# Patient Record
Sex: Male | Born: 1960 | Hispanic: Yes | Marital: Married | Smoking: Never smoker
Health system: Southern US, Community
[De-identification: ages and names within clinical notes are randomized; demographics above are authoritative.]

## PROBLEM LIST (undated history)

## (undated) DIAGNOSIS — E119 Type 2 diabetes mellitus without complications: Secondary | ICD-10-CM

## (undated) DIAGNOSIS — I10 Essential (primary) hypertension: Secondary | ICD-10-CM

---

## 2015-02-22 ENCOUNTER — Inpatient Hospital Stay (HOSPITAL_COMMUNITY)
Admission: EM | Admit: 2015-02-22 | Discharge: 2015-02-25 | DRG: 188 | Disposition: A | Discharge status: Home / Self Care | Payer: BC Managed Care – PPO | Attending: Internal Medicine | Admitting: Internal Medicine

## 2015-02-22 ENCOUNTER — Encounter (HOSPITAL_COMMUNITY): Payer: Self-pay | Admitting: Emergency Medicine

## 2015-02-22 ENCOUNTER — Emergency Department (HOSPITAL_COMMUNITY): Payer: BC Managed Care – PPO

## 2015-02-22 DIAGNOSIS — Z87891 Personal history of nicotine dependence: Secondary | ICD-10-CM

## 2015-02-22 DIAGNOSIS — J189 Pneumonia, unspecified organism: Secondary | ICD-10-CM | POA: Diagnosis present

## 2015-02-22 DIAGNOSIS — E119 Type 2 diabetes mellitus without complications: Secondary | ICD-10-CM

## 2015-02-22 DIAGNOSIS — E785 Hyperlipidemia, unspecified: Secondary | ICD-10-CM | POA: Diagnosis present

## 2015-02-22 DIAGNOSIS — J90 Pleural effusion, not elsewhere classified: Principal | ICD-10-CM | POA: Diagnosis present

## 2015-02-22 DIAGNOSIS — I10 Essential (primary) hypertension: Secondary | ICD-10-CM | POA: Diagnosis present

## 2015-02-22 DIAGNOSIS — R0600 Dyspnea, unspecified: Secondary | ICD-10-CM | POA: Diagnosis not present

## 2015-02-22 HISTORY — DX: Type 2 diabetes mellitus without complications: E11.9

## 2015-02-22 HISTORY — DX: Essential (primary) hypertension: I10

## 2015-02-22 LAB — CBC
HEMATOCRIT: 41 % (ref 39.0–52.0)
HEMOGLOBIN: 13.8 g/dL (ref 13.0–17.0)
MCH: 32.6 pg (ref 26.0–34.0)
MCHC: 33.7 g/dL (ref 30.0–36.0)
MCV: 96.9 fL (ref 78.0–100.0)
Platelets: 238 10*3/uL (ref 150–400)
RBC: 4.23 MIL/uL (ref 4.22–5.81)
RDW: 12.3 % (ref 11.5–15.5)
WBC: 8 10*3/uL (ref 4.0–10.5)

## 2015-02-22 LAB — BASIC METABOLIC PANEL
Anion gap: 12 (ref 5–15)
BUN: 11 mg/dL (ref 6–20)
CO2: 25 mmol/L (ref 22–32)
Calcium: 9 mg/dL (ref 8.9–10.3)
Chloride: 100 mmol/L — ABNORMAL LOW (ref 101–111)
Creatinine, Ser: 1.07 mg/dL (ref 0.61–1.24)
GFR calc non Af Amer: >60 mL/min (ref >60)
Glucose, Bld: 128 mg/dL — ABNORMAL HIGH (ref 65–99)
Potassium: 3.9 mmol/L (ref 3.5–5.1)
Sodium: 137 mmol/L (ref 135–145)

## 2015-02-22 LAB — I-STAT TROPONIN, ED: TROPONIN I, POC: 0.01 ng/mL (ref 0.00–0.08)

## 2015-02-22 MED ORDER — IOHEXOL 300 MG/ML  SOLN
75.0000 mL | Freq: Once | INTRAMUSCULAR | Status: AC | PRN
  Administered 2015-02-22: 75 mL via INTRAVENOUS

## 2015-02-22 MED ORDER — DEXTROSE 5 % IV SOLN
1.0000 g | INTRAVENOUS | Status: DC
  Administered 2015-02-22: 1 g via INTRAVENOUS
  Filled 2015-02-22 (×2): qty 10

## 2015-02-22 MED ORDER — DEXTROSE 5 % IV SOLN
500.0000 mg | INTRAVENOUS | Status: DC
  Administered 2015-02-22: 500 mg via INTRAVENOUS
  Filled 2015-02-22 (×2): qty 500

## 2015-02-22 NOTE — ED Notes (Signed)
PA at bedside.

## 2015-02-22 NOTE — ED Provider Notes (Signed)
CSN: [id number]     Arrival date & time 02/22/15  2027 History   First MD Initiated Contact with Patient 02/22/15 2130     Chief Complaint  Patient presents with  . Shortness of Breath     (Consider location/radiation/quality/duration/timing/severity/associated sxs/prior Treatment) HPI Comments: 54 year old male with a past medical history of diabetes presenting from [facility] urgent care in [place] complaining of shortness of breath 1 week. At urgent care, he had a chest x-ray that showed a "right lung problem", was given 2 shots (patient is not sure what the shots were) and was advised to go to the emergency department. Over the past week, he's had shortness of breath both at rest and on exertion, feels winded and has had wheezing. Symptoms are worsening over the past 2 days. Admits to associated hacking cough that is occasionally productive beginning yesterday. Denies fever, chills, chest pain. Reports a discomfort across the middle of his back. No recent travel or illness. No recent hospital admissions. Has not smoked for many years.  Patient is a 54 y.o. male presenting with shortness of breath. The history is provided by the patient and a relative.  Shortness of Breath Associated symptoms: cough     Past Medical History  Diagnosis Date  . Diabetes mellitus without complication     Patient states he takes 2 metformin pills twice a day.   History reviewed. No pertinent past surgical history. No family history on file. History  Substance Use Topics  . Smoking status: Never Smoker   . Smokeless tobacco: Not on file  . Alcohol Use: Yes    Review of Systems  Constitutional: Positive for fatigue.  Respiratory: Positive for cough and shortness of breath.   All other systems reviewed and are negative.     Allergies  Review of patient's allergies indicates no known allergies.  Home Medications   Prior to Admission medications   Medication Sig Start Date End Date Taking?  Authorizing Provider  atorvastatin (LIPITOR) 40 MG tablet Take 40 mg by mouth daily. 02/13/15  Yes Historical Provider, MD  metFORMIN (GLUCOPHAGE-XR) 500 MG 24 hr tablet Take 1,000 mg by mouth 2 (two) times daily with a meal. 02/08/15  Yes Historical Provider, MD  ramipril (ALTACE) 2.5 MG capsule Take 2.5 mg by mouth daily. To protect kidneys 02/08/15  Yes Historical Provider, MD   BP 120/75 mmHg  Pulse 70  Temp(Src) 98.1 F (36.7 C) (Oral)  Resp 21  Wt 214 lb 6.4 oz (97.251 kg)  SpO2 92% Physical Exam  Constitutional: He is oriented to person, place, and time. He appears well-developed and well-nourished. No distress.  HENT:  Head: Normocephalic and atraumatic.  Mouth/Throat: Oropharynx is clear and moist.  Eyes: Conjunctivae and EOM are normal. Pupils are equal, round, and reactive to light.  Neck: Normal range of motion. Neck supple. No JVD present.  Cardiovascular: Normal rate, regular rhythm, normal heart sounds and intact distal pulses.   No extremity edema.  Pulmonary/Chest: Effort normal. No respiratory distress. He has decreased breath sounds in the right middle field and the right lower field.  Abdominal: Soft. Bowel sounds are normal. There is no tenderness.  Musculoskeletal: Normal range of motion. He exhibits no edema.  Neurological: He is alert and oriented to person, place, and time. He has normal strength. No sensory deficit.  Speech fluent, goal oriented. Moves extremities without ataxia. Equal grip strength bilateral.  Skin: Skin is warm and dry. He is not diaphoretic.  Psychiatric: He has a  normal mood and affect. His behavior is normal.  Nursing note and vitals reviewed.   ED Course  Procedures (including critical care time) Labs Review Labs Reviewed  BASIC METABOLIC PANEL - Abnormal; Notable for the following:    Chloride 100 (*)    Glucose, Bld 128 (*)    All other components within normal limits  CBC  I-STAT TROPOININ, ED    Imaging Review Dg Chest 2  View  02/22/2015   CLINICAL DATA:  Acute onset of shortness of breath and wheezing. Initial encounter.  EXAM: CHEST  2 VIEW  COMPARISON:  Chest radiograph performed 05/29/2011  FINDINGS: The lungs are well-aerated. A small right pleural effusion is noted, with associated airspace opacification. Underlying vascular congestion is noted. This may reflect mild asymmetric interstitial edema or possibly pneumonia. There is no evidence of pneumothorax.  The heart is borderline normal in size. No acute osseous abnormalities are seen.  IMPRESSION: Small right pleural effusion with associated airspace opacification. Underlying vascular congestion noted. This may reflect mild asymmetric interstitial edema or possibly pneumonia. Followup PA and lateral chest X-ray is recommended in 3-4 weeks following trial of antibiotic therapy to ensure resolution and exclude underlying malignancy.   Electronically Signed   By: Roanna Raider M.D.   On: 02/22/2015 21:23   Ct Chest W Contrast  02/23/2015   CLINICAL DATA:  54 year old male with shortness of breath and wheezing  EXAM: CT CHEST WITH CONTRAST  TECHNIQUE: Multidetector CT imaging of the chest was performed during intravenous contrast administration.  CONTRAST:  75mL OMNIPAQUE IOHEXOL 300 MG/ML  SOLN  COMPARISON:  Chest radiograph dated 02/22/2015 and CT dated 06/02/2011  FINDINGS: There is a large right-sided pleural effusion with associated partial compressive atelectasis of the right upper and right lung. An area of consolidation with air bronchogram in the right lung base may represent atelectatic changes. Pneumonia or underlying mass is not excluded. Clinical correlation and follow-up recommended. The left lung is clear. There is no pneumothorax. The central airways are patent.  The visualized thoracic aorta appears unremarkable. The pulmonary trunk and central pulmonary arteries are patent. There is no adenopathy. No cardiomegaly or pericardial effusion. The visualized  esophagus and thyroid gland appear unremarkable.  The chest wall is unremarkable. There is mild degenerative changes of the thoracic spine. No acute fracture. The visualized upper abdomen appear unremarkable.  There is mild haziness of the upper abdominal mesentery with multiple top-normal lymph nodes with a "misty mesentery" appearance. This finding is nonspecific but may be related to underlying inflammatory/infectious etiology. Small splenic hypodense lesions are not well characterized but may represent hemangioma.  IMPRESSION: Large possibly loculated right pleural effusion with compressive atelectasis of right lung. Superimposed pneumonia loose underlying mass is not excluded. Clinical correlation and follow-up recommended. No pneumothorax.   Electronically Signed   By: Elgie Collard M.D.   On: 02/23/2015 00:06     EKG Interpretation   Date/Time:  Friday February 22 2015 20:32:27 EDT Ventricular Rate:  75 PR Interval:  158 QRS Duration: 106 QT Interval:  402 QTC Calculation: 448 R Axis:     Text Interpretation:  Normal sinus rhythm Moderate voltage criteria for  LVH, may be normal variant Abnormal ekg No old tracing to compare  Confirmed by MILLER  MD, BRIAN ([id number]) on 02/22/2015 10:12:49 PM      MDM   Final diagnoses:  Pleural effusion, right   Non-toxic appearing, NAD. AFVSS. SOB x 1 week feeling winded. CXR significant for right pleural effusion  with associated airspace opacification. Decreased breath sounds on R. Bedside US done by Dr. Hyacinth Meeker confirming pleural effusion. IV azithro and rocephin started. CT obtained hospitalist to evaluate extent of effusion to determine admission. CT confirming large possibly loculated R pleural effusion with compressive atelectasis of R lung. Pt admitted by Dr. Allena Katz.  Discussed with attending Dr. Hyacinth Meeker who also evaluated patient and agrees with plan of care.  Kathrynn Speed, PA-C 02/23/15 [id number]  Eber Hong, MD 02/23/15 [id number]

## 2015-02-22 NOTE — ED Notes (Signed)
MD at bedside. 

## 2015-02-22 NOTE — ED Notes (Signed)
States he was sent from [facility] for "right lung problem."  States MD was suppose to send information to hospital.  Reports sob and wheezing x 1 week that has been worse over the past 2 days.

## 2015-02-22 NOTE — ED Provider Notes (Signed)
54 year old male with no significant medical history presents with increased shortness of breath and dyspnea on exertion. He has a chest discomfort and a back discomfort, coughing for several weeks, no systemic symptoms, went to urgent care with a told him there was something abnormal in his right lung and redirected him to the hospital. On exam the patient has overall clear lung sounds though he has decreased sounds at the right base. There is dullness to percussion at the right posterior thorax, no peripheral edema, normal lung sounds otherwise, normal heart sounds, bedside ultrasound reveals that the patient has a pleural effusion in the right hemithorax.  Chest x-ray reveals pleural effusion, would consider potential parapneumonic effusion, labs unremarkable, anticipate the need for thoracentesis and antibiotics. Will need admission if this cannot be handled outpatient. Discussed with patient  Medical screening examination/treatment/procedure(s) were conducted as a shared visit with non-physician practitioner(s) and myself.  I personally evaluated the patient during the encounter.  Clinical Impression:   Final diagnoses:  Pleural effusion, right         Eber Hong, MD 02/23/15 1038

## 2015-02-22 NOTE — ED Notes (Signed)
Patient transported to CT 

## 2015-02-23 ENCOUNTER — Inpatient Hospital Stay (HOSPITAL_COMMUNITY): Payer: BC Managed Care – PPO

## 2015-02-23 ENCOUNTER — Encounter (HOSPITAL_COMMUNITY): Payer: Self-pay | Admitting: *Deleted

## 2015-02-23 DIAGNOSIS — J189 Pneumonia, unspecified organism: Secondary | ICD-10-CM | POA: Diagnosis present

## 2015-02-23 DIAGNOSIS — E785 Hyperlipidemia, unspecified: Secondary | ICD-10-CM | POA: Diagnosis present

## 2015-02-23 DIAGNOSIS — I10 Essential (primary) hypertension: Secondary | ICD-10-CM

## 2015-02-23 DIAGNOSIS — J948 Other specified pleural conditions: Secondary | ICD-10-CM

## 2015-02-23 DIAGNOSIS — J90 Pleural effusion, not elsewhere classified: Secondary | ICD-10-CM | POA: Diagnosis present

## 2015-02-23 DIAGNOSIS — R600 Localized edema: Secondary | ICD-10-CM

## 2015-02-23 DIAGNOSIS — Z87891 Personal history of nicotine dependence: Secondary | ICD-10-CM | POA: Diagnosis not present

## 2015-02-23 DIAGNOSIS — E119 Type 2 diabetes mellitus without complications: Secondary | ICD-10-CM | POA: Diagnosis present

## 2015-02-23 DIAGNOSIS — R0600 Dyspnea, unspecified: Secondary | ICD-10-CM | POA: Diagnosis present

## 2015-02-23 HISTORY — DX: Essential (primary) hypertension: I10

## 2015-02-23 LAB — COMPREHENSIVE METABOLIC PANEL
ALBUMIN: 3.5 g/dL (ref 3.5–5.0)
ALT: 28 U/L (ref 17–63)
AST: 22 U/L (ref 15–41)
Alkaline Phosphatase: 60 U/L (ref 38–126)
Anion gap: 9 (ref 5–15)
BILIRUBIN TOTAL: 0.6 mg/dL (ref 0.3–1.2)
BUN: 9 mg/dL (ref 6–20)
CALCIUM: 9 mg/dL (ref 8.9–10.3)
CO2: 25 mmol/L (ref 22–32)
CREATININE: 0.9 mg/dL (ref 0.61–1.24)
Chloride: 102 mmol/L (ref 101–111)
GFR calc Af Amer: >60 mL/min (ref >60)
Glucose, Bld: 161 mg/dL — ABNORMAL HIGH (ref 65–99)
Potassium: 4 mmol/L (ref 3.5–5.1)
Sodium: 136 mmol/L (ref 135–145)
Total Protein: 7.4 g/dL (ref 6.5–8.1)

## 2015-02-23 LAB — CBC WITH DIFFERENTIAL/PLATELET
Basophils Absolute: 0 10*3/uL (ref 0.0–0.1)
Basophils Relative: 1 % (ref 0–1)
Eosinophils Absolute: 0.1 10*3/uL (ref 0.0–0.7)
Eosinophils Relative: 2 % (ref 0–5)
HEMATOCRIT: 37.9 % — AB (ref 39.0–52.0)
HEMOGLOBIN: 12.8 g/dL — AB (ref 13.0–17.0)
LYMPHS ABS: 1 10*3/uL (ref 0.7–4.0)
LYMPHS PCT: 15 % (ref 12–46)
MCH: 32 pg (ref 26.0–34.0)
MCHC: 33.8 g/dL (ref 30.0–36.0)
MCV: 94.8 fL (ref 78.0–100.0)
MONO ABS: 0.4 10*3/uL (ref 0.1–1.0)
Monocytes Relative: 6 % (ref 3–12)
Neutro Abs: 4.8 10*3/uL (ref 1.7–7.7)
Neutrophils Relative %: 76 % (ref 43–77)
Platelets: 218 10*3/uL (ref 150–400)
RBC: 4 MIL/uL — AB (ref 4.22–5.81)
RDW: 12.2 % (ref 11.5–15.5)
WBC: 6.3 10*3/uL (ref 4.0–10.5)

## 2015-02-23 LAB — BODY FLUID CELL COUNT WITH DIFFERENTIAL
Eos, Fluid: 24 %
Lymphs, Fluid: 52 %
Monocyte-Macrophage-Serous Fluid: 10 % — ABNORMAL LOW (ref 50–90)
Neutrophil Count, Fluid: 12 % (ref 0–25)
Other Cells, Fluid: 2 %
WBC FLUID: 2475 uL — AB (ref 0–1000)

## 2015-02-23 LAB — GLUCOSE, CAPILLARY
GLUCOSE-CAPILLARY: 229 mg/dL — AB (ref 65–99)
GLUCOSE-CAPILLARY: 145 mg/dL — AB (ref 65–99)
Glucose-Capillary: 111 mg/dL — ABNORMAL HIGH (ref 65–99)

## 2015-02-23 LAB — INFLUENZA PANEL BY PCR (TYPE A & B)
H1N1FLUPCR: NOT DETECTED
Influenza A By PCR: NEGATIVE
Influenza B By PCR: NEGATIVE

## 2015-02-23 LAB — LACTATE DEHYDROGENASE, PLEURAL OR PERITONEAL FLUID: LD FL: 688 U/L — AB (ref 3–23)

## 2015-02-23 LAB — PROTEIN, BODY FLUID: TOTAL PROTEIN, FLUID: 5 g/dL

## 2015-02-23 LAB — LACTATE DEHYDROGENASE: LDH: 148 U/L (ref 98–192)

## 2015-02-23 LAB — HIV ANTIBODY (ROUTINE TESTING W REFLEX): HIV SCREEN 4TH GENERATION: NONREACTIVE

## 2015-02-23 LAB — GLUCOSE, SEROUS FLUID: Glucose, Fluid: 130 mg/dL

## 2015-02-23 LAB — STREP PNEUMONIAE URINARY ANTIGEN: Strep Pneumo Urinary Antigen: NEGATIVE

## 2015-02-23 MED ORDER — INSULIN ASPART 100 UNIT/ML ~~LOC~~ SOLN
0.0000 [IU] | Freq: Three times a day (TID) | SUBCUTANEOUS | Status: DC
  Administered 2015-02-23: 3 [IU] via SUBCUTANEOUS
  Administered 2015-02-24: 1 [IU] via SUBCUTANEOUS
  Administered 2015-02-24: 2 [IU] via SUBCUTANEOUS
  Administered 2015-02-25: 1 [IU] via SUBCUTANEOUS

## 2015-02-23 MED ORDER — ACETAMINOPHEN 325 MG PO TABS
650.0000 mg | ORAL_TABLET | Freq: Four times a day (QID) | ORAL | Status: DC | PRN
  Administered 2015-02-23: 650 mg via ORAL
  Filled 2015-02-23: qty 2

## 2015-02-23 MED ORDER — LIDOCAINE HCL (PF) 1 % IJ SOLN
INTRAMUSCULAR | Status: AC
  Filled 2015-02-23: qty 10

## 2015-02-23 MED ORDER — ATORVASTATIN CALCIUM 40 MG PO TABS
40.0000 mg | ORAL_TABLET | Freq: Every day | ORAL | Status: DC
  Administered 2015-02-23 – 2015-02-25 (×3): 40 mg via ORAL
  Filled 2015-02-23 (×3): qty 1

## 2015-02-23 MED ORDER — INSULIN ASPART 100 UNIT/ML ~~LOC~~ SOLN: 0.0000 [IU] | Freq: Every day | SUBCUTANEOUS | Status: DC

## 2015-02-23 MED ORDER — CEFTRIAXONE SODIUM IN DEXTROSE 20 MG/ML IV SOLN
1.0000 g | INTRAVENOUS | Status: DC
Start: 2015-02-23 — End: 2015-02-25
  Administered 2015-02-23 – 2015-02-24 (×2): 1 g via INTRAVENOUS
  Filled 2015-02-23 (×3): qty 50

## 2015-02-23 MED ORDER — DEXTROSE 5 % IV SOLN
500.0000 mg | INTRAVENOUS | Status: DC
  Administered 2015-02-23 – 2015-02-25 (×2): 500 mg via INTRAVENOUS
  Filled 2015-02-23 (×3): qty 500

## 2015-02-23 NOTE — H&P (Signed)
[facility] History and Physical  Patient: Donald Knox  MRN: [id number]  DOB: 1961-05-13  DOS: the patient was seen and examined on 02/23/2015 PCP: Marylynn Pearson, MD  Referring physician: Dr. Hyacinth Meeker Chief Complaint: Shortness of breath  HPI: Donald Knox is a 54 y.o. male with Past medical history of diabetes mellitus and hypertension with dyslipidemia. The patient presents with complaints of shortness of breath ongoing for last 3-4 days associated with cough ongoing since last 2 days. He also has generalized fatigue and weakness. No fever no chills no chest pain no nausea no vomiting no headache no dizziness no lightheadedness no choking episode and no night sweats known weight loss no lumps or bumps anywhere, no diarrhea no constipation no burning urination no leg swelling or leg rash no recent change in medications. Patient mentions that he has recently traveled to [place] and back which is a 16 Hour Dr. Patient has chronic swelling of his legs. No change in his weight reported. No exposure no travel outside the [place]. No exposure to heavy chemicals or PACs. Patient works as a Copy in school. Denies any drug abuse.  The patient is coming from home.  At his baseline ambulates without any support And is independent for most of his ADL manages her medication on his own.  Review of Systems: as mentioned in the history of present illness.  A comprehensive review of the other systems is negative.  Past Medical History  Diagnosis Date  . Diabetes mellitus without complication     Patient states he takes 2 metformin pills twice a day.  . Essential hypertension 02/23/2015   History reviewed. No pertinent past surgical history. Social History:  reports that he has never smoked. He does not have any smokeless tobacco history on file. He reports that he drinks alcohol. He reports that he does not use illicit drugs.  No Known Allergies  History reviewed. No  pertinent family history.  Prior to Admission medications   Medication Sig Start Date End Date Taking? Authorizing Provider  atorvastatin (LIPITOR) 40 MG tablet Take 40 mg by mouth daily. 02/13/15  Yes Historical Provider, MD  metFORMIN (GLUCOPHAGE-XR) 500 MG 24 hr tablet Take 1,000 mg by mouth 2 (two) times daily with a meal. 02/08/15  Yes Historical Provider, MD  ramipril (ALTACE) 2.5 MG capsule Take 2.5 mg by mouth daily. To protect kidneys 02/08/15  Yes Historical Provider, MD    Physical Exam: Filed Vitals:   02/22/15 2349 02/23/15 0015 02/23/15 0057 02/23/15 0534  BP: 121/70 124/67 122/71 115/69  Pulse: 72 77 71 63  Temp:   98.3 F (36.8 C) 97.7 F (36.5 C)  TempSrc:   Oral Oral  Resp: 21 21 20 21   Height:   5\' 8"  (1.727 m)   Weight:   96.4 kg (212 lb 8.4 oz) 96.4 kg (212 lb 8.4 oz)  SpO2: 93% 93% 94% 94%    General: Alert, Awake and Oriented to Time, Place and Person. Appear in mild distress Eyes: PERRL ENT: Oral Mucosa clear moist Neck: no JVD Cardiovascular: S1 and S2 Present, no Murmur, Peripheral Pulses Present Respiratory: Bilateral Air entry equal and Decreased,  Basal Crackles, no wheezes Abdomen: Bowel Sound present, Soft and non tender Skin: no Rash Extremities: Trace Pedal edema, no calf tenderness Neurologic: Grossly no focal neuro deficit.  Labs on Admission:  CBC:  Recent Labs Lab 02/22/15 2036 02/23/15 0222  WBC 8.0 6.3  NEUTROABS  --  4.8  HGB 13.8 12.8*  HCT 41.0  37.9*  MCV 96.9 94.8  PLT 238 218    CMP     Component Value Date/Time   NA 136 02/23/2015 0222   K 4.0 02/23/2015 0222   CL 102 02/23/2015 0222   CO2 25 02/23/2015 0222   GLUCOSE 161* 02/23/2015 0222   BUN 9 02/23/2015 0222   CREATININE 0.90 02/23/2015 0222   CALCIUM 9.0 02/23/2015 0222   PROT 7.4 02/23/2015 0222   ALBUMIN 3.5 02/23/2015 0222   AST 22 02/23/2015 0222   ALT 28 02/23/2015 0222   ALKPHOS 60 02/23/2015 0222   BILITOT 0.6 02/23/2015 0222   GFRNONAA >60  02/23/2015 0222   GFRAA >60 02/23/2015 0222    No results for input(s): LIPASE, AMYLASE in the last 168 hours.  No results for input(s): CKTOTAL, CKMB, CKMBINDEX, TROPONINI in the last 168 hours. BNP (last 3 results) No results for input(s): BNP in the last 8760 hours.  ProBNP (last 3 results) No results for input(s): PROBNP in the last 8760 hours.   Radiological Exams on Admission: Dg Chest 2 View  02/22/2015   CLINICAL DATA:  Acute onset of shortness of breath and wheezing. Initial encounter.  EXAM: CHEST  2 VIEW  COMPARISON:  Chest radiograph performed 05/29/2011  FINDINGS: The lungs are well-aerated. A small right pleural effusion is noted, with associated airspace opacification. Underlying vascular congestion is noted. This may reflect mild asymmetric interstitial edema or possibly pneumonia. There is no evidence of pneumothorax.  The heart is borderline normal in size. No acute osseous abnormalities are seen.  IMPRESSION: Small right pleural effusion with associated airspace opacification. Underlying vascular congestion noted. This may reflect mild asymmetric interstitial edema or possibly pneumonia. Followup PA and lateral chest X-ray is recommended in 3-4 weeks following trial of antibiotic therapy to ensure resolution and exclude underlying malignancy.   Electronically Signed   By: Roanna Raider M.D.   On: 02/22/2015 21:23   Ct Chest W Contrast  02/23/2015   CLINICAL DATA:  54 year old male with shortness of breath and wheezing  EXAM: CT CHEST WITH CONTRAST  TECHNIQUE: Multidetector CT imaging of the chest was performed during intravenous contrast administration.  CONTRAST:  75mL OMNIPAQUE IOHEXOL 300 MG/ML  SOLN  COMPARISON:  Chest radiograph dated 02/22/2015 and CT dated 06/02/2011  FINDINGS: There is a large right-sided pleural effusion with associated partial compressive atelectasis of the right upper and right lung. An area of consolidation with air bronchogram in the right lung  base may represent atelectatic changes. Pneumonia or underlying mass is not excluded. Clinical correlation and follow-up recommended. The left lung is clear. There is no pneumothorax. The central airways are patent.  The visualized thoracic aorta appears unremarkable. The pulmonary trunk and central pulmonary arteries are patent. There is no adenopathy. No cardiomegaly or pericardial effusion. The visualized esophagus and thyroid gland appear unremarkable.  The chest wall is unremarkable. There is mild degenerative changes of the thoracic spine. No acute fracture. The visualized upper abdomen appear unremarkable.  There is mild haziness of the upper abdominal mesentery with multiple top-normal lymph nodes with a "misty mesentery" appearance. This finding is nonspecific but may be related to underlying inflammatory/infectious etiology. Small splenic hypodense lesions are not well characterized but may represent hemangioma.  IMPRESSION: Large possibly loculated right pleural effusion with compressive atelectasis of right lung. Superimposed pneumonia loose underlying mass is not excluded. Clinical correlation and follow-up recommended. No pneumothorax.   Electronically Signed   By: Elgie Collard M.D.   On: 02/23/2015  00:06   EKG: Independently reviewed. normal sinus rhythm, nonspecific ST and T waves changes.  Assessment/Plan Principal Problem:   Pleural effusion, right Active Problems:   CAP (community acquired pneumonia)   Essential hypertension   Dyslipidemia   1. Pleural effusion, right  Community-acquired pneumonia.  The patient presented with complain of shortness of breath with dry cough. CT scan is showing large right-sided pleural effusion with loculation. It is also possible superimposed pneumonia. With this the patient is currently admitted in the hospital. I would give him ceftriaxone azithromycin as well as IV fluids. Follow cultures. We will check thoracentesis in the  morning.  2. Essential hypertension. Holding his home blood pressure medications at present.  3. History of diabetes mellitus. We will place the patient on sliding scale. Holding metformin.  4. Dyslipidemia. Continue Lipitor.  5. Trace edema. Lower extremity Doppler in the morning.  Advance goals of care discussion: Full code   DVT Prophylaxis: subcutaneous Heparin Nutrition: Nothing by mouth after midnight  Family Communication: family was present at bedside, opportunity was given to ask question and all questions were answered satisfactorily at the time of interview. Disposition: Admitted as inpatient, telemetry.  Author: Lynden Oxford, MD [facility] Pager: [phone] 02/23/2015  If 7PM-7AM, please contact night-coverage [email or web address] Password [email or web address]

## 2015-02-23 NOTE — Progress Notes (Signed)
PROGRESS NOTE  Donald Knox ZOX:[id number] DOB: 12-27-1960 DOA: 02/22/2015 PCP: Marylynn Pearson, MD  Brief history 54 year old gentleman with a history of diabetes mellitus, hypertension, hyperlipidemia presents with one-week history of worsening shortness of breath and dyspnea on exertion. The patient denies any fevers, chills, chest discomfort. 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 The patient recently had a long drive to [place] ([id number]) and back in mid June 2016. CT of the chest revealed a large right-sided pleural loculated effusion.  WBC was 8.0, presentation. The patient has a previous history of tobacco smoking over 20 pack years. He quit 10 years ago. He denies any other occupational exposures. There's been no hemoptysis, nausea, vomiting, diarrhea.  Interventional radiology was consulted for thoracocentesis. Of note, the patient states that he has had his first voice throughout his life without any changes. He denies any involuntary weight loss. Assessment/Plan: Pleural effusion -IR for thoracocentesis -Serum LDH -Some pleural fluid for LDH, protein, glucose, Gram stain and culture -echo -pt received abx 7/22 which may compromise cutlure Diabetes mellitus type 2 -Hemoglobin A1c -NovoLog sliding scale -Discontinue metformin Hypertension -Controlled off of ramipril Hyperlipidemia -Continue atorvastatin  Family Communication:   Wife updated at beside Disposition Plan:   Home when medically stable      Procedures/Studies: Dg Chest 2 View  02/22/2015   CLINICAL DATA:  Acute onset of shortness of breath and wheezing. Initial encounter.  EXAM: CHEST  2 VIEW  COMPARISON:  Chest radiograph performed 05/29/2011  FINDINGS: The lungs are well-aerated. A small right pleural effusion is noted, with associated airspace opacification. Underlying vascular congestion is noted. This may reflect mild asymmetric interstitial edema or possibly pneumonia. There  is no evidence of pneumothorax.  The heart is borderline normal in size. No acute osseous abnormalities are seen.  IMPRESSION: Small right pleural effusion with associated airspace opacification. Underlying vascular congestion noted. This may reflect mild asymmetric interstitial edema or possibly pneumonia. Followup PA and lateral chest X-ray is recommended in 3-4 weeks following trial of antibiotic therapy to ensure resolution and exclude underlying malignancy.   Electronically Signed   By: Roanna Raider M.D.   On: 02/22/2015 21:23   Ct Chest W Contrast  02/23/2015   CLINICAL DATA:  54 year old male with shortness of breath and wheezing  EXAM: CT CHEST WITH CONTRAST  TECHNIQUE: Multidetector CT imaging of the chest was performed during intravenous contrast administration.  CONTRAST:  75mL OMNIPAQUE IOHEXOL 300 MG/ML  SOLN  COMPARISON:  Chest radiograph dated 02/22/2015 and CT dated 06/02/2011  FINDINGS: There is a large right-sided pleural effusion with associated partial compressive atelectasis of the right upper and right lung. An area of consolidation with air bronchogram in the right lung base may represent atelectatic changes. Pneumonia or underlying mass is not excluded. Clinical correlation and follow-up recommended. The left lung is clear. There is no pneumothorax. The central airways are patent.  The visualized thoracic aorta appears unremarkable. The pulmonary trunk and central pulmonary arteries are patent. There is no adenopathy. No cardiomegaly or pericardial effusion. The visualized esophagus and thyroid gland appear unremarkable.  The chest wall is unremarkable. There is mild degenerative changes of the thoracic spine. No acute fracture. The visualized upper abdomen appear unremarkable.  There is mild haziness of the upper abdominal mesentery with multiple top-normal lymph nodes with a "misty mesentery" appearance. This finding is nonspecific but may be related to underlying  inflammatory/infectious etiology. Small splenic hypodense  lesions are not well characterized but may represent hemangioma.  IMPRESSION: Large possibly loculated right pleural effusion with compressive atelectasis of right lung. Superimposed pneumonia loose underlying mass is not excluded. Clinical correlation and follow-up recommended. No pneumothorax.   Electronically Signed   By: Elgie Collard M.D.   On: 02/23/2015 00:06         Subjective: Still has some dyspnea on exertion. Otherwise, patient denies fevers, chills, headache, chest pain, dyspnea, nausea, vomiting, diarrhea, abdominal pain, dysuria, hematuria   Objective: Filed Vitals:   02/22/15 2349 02/23/15 0015 02/23/15 0057 02/23/15 0534  BP: 121/70 124/67 122/71 115/69  Pulse: 72 77 71 63  Temp:   98.3 F (36.8 C) 97.7 F (36.5 C)  TempSrc:   Oral Oral  Resp: 21 21 20 21   Height:   5\' 8"  (1.727 m)   Weight:   96.4 kg (212 lb 8.4 oz) 96.4 kg (212 lb 8.4 oz)  SpO2: 93% 93% 94% 94%    Intake/Output Summary (Last 24 hours) at 02/23/15 0846 Last data filed at 02/23/15 1610  Gross per 24 hour  Intake      0 ml  Output    500 ml  Net   -500 ml   Weight change:  Exam:   General:  Pt is alert, follows commands appropriately, not in acute distress  HEENT: No icterus, No thrush, No neck mass, [place]/AT  Cardiovascular: RRR, S1/S2, no rubs, no gallops  Respiratory: Bibasilar crackles, diminished breath sounds right base.  Abdomen: Soft/+BS, non tender, non distended, no guarding; no hepatosplenomegaly  Extremities: No edema, No lymphangitis, No petechiae, No rashes, no synovitis; no cyanosis or clubbing  Data Reviewed: Basic Metabolic Panel:  Recent Labs Lab 02/22/15 2036 02/23/15 0222  NA 137 136  K 3.9 4.0  CL 100* 102  CO2 25 25  GLUCOSE 128* 161*  BUN 11 9  CREATININE 1.07 0.90  CALCIUM 9.0 9.0   Liver Function Tests:  Recent Labs Lab 02/23/15 0222  AST 22  ALT 28  ALKPHOS 60  BILITOT 0.6    PROT 7.4  ALBUMIN 3.5   No results for input(s): LIPASE, AMYLASE in the last 168 hours. No results for input(s): AMMONIA in the last 168 hours. CBC:  Recent Labs Lab 02/22/15 2036 02/23/15 0222  WBC 8.0 6.3  NEUTROABS  --  4.8  HGB 13.8 12.8*  HCT 41.0 37.9*  MCV 96.9 94.8  PLT 238 218   Cardiac Enzymes: No results for input(s): CKTOTAL, CKMB, CKMBINDEX, TROPONINI in the last 168 hours. BNP: Invalid input(s): POCBNP CBG:  Recent Labs Lab 02/23/15 0634  GLUCAP 111*    Recent Results (from the past 240 hour(s))  Culture, blood (routine x 2) Call MD if unable to obtain prior to antibiotics being given     Status: None (Preliminary result)   Collection Time: 02/23/15  2:22 AM  Result Value Ref Range Status   Specimen Description BLOOD LEFT ANTECUBITAL  Final   Special Requests BOTTLES DRAWN AEROBIC AND ANAEROBIC 10CC  Final   Culture PENDING  Incomplete   Report Status PENDING  Incomplete     Scheduled Meds: . atorvastatin  40 mg Oral Daily  . azithromycin  500 mg Intravenous Q24H  . azithromycin  500 mg Intravenous Q24H  . cefTRIAXone (ROCEPHIN)  IV  1 g Intravenous Q24H  . cefTRIAXone (ROCEPHIN)  IV  1 g Intravenous Q24H  . insulin aspart  0-5 Units Subcutaneous QHS  . insulin aspart  0-9  Units Subcutaneous TID WC   Continuous Infusions:    Saliyah Gillin, DO  [facility] Pager [phone]  If 7PM-7AM, please contact night-coverage [email or web address] Password [facility] 02/23/2015, 8:46 AM   LOS: 0 days

## 2015-02-23 NOTE — Progress Notes (Signed)
Pt reports "I feel good" this evening.  Accompanied by various family members throughout the day.  Daughter asked for results of tests on pleural fluid.  Read remarkable values to her without interpretation, and gave printed material on LD level by request.  Pt awaits echocardiogram.  Will con't plan of care.

## 2015-02-23 NOTE — Procedures (Signed)
Interventional Radiology Procedure Note  Procedure:  Ultrasound guided right thoracentesis  Complications:  None  Estimated Blood Loss: <10 mL  Findings:  Grossly bloody right pleural fluid, dark blood.  1.4 L removed.  Post CXR pending  Hildy Nicholl T. Fredia Sorrow, M.D Pager:  [phone]

## 2015-02-24 ENCOUNTER — Inpatient Hospital Stay (HOSPITAL_COMMUNITY): Payer: BC Managed Care – PPO

## 2015-02-24 ENCOUNTER — Encounter (HOSPITAL_COMMUNITY): Payer: Self-pay | Admitting: Radiology

## 2015-02-24 LAB — BASIC METABOLIC PANEL
Anion gap: 7 (ref 5–15)
BUN: 14 mg/dL (ref 6–20)
CO2: 27 mmol/L (ref 22–32)
Calcium: 8.8 mg/dL — ABNORMAL LOW (ref 8.9–10.3)
Chloride: 99 mmol/L — ABNORMAL LOW (ref 101–111)
Creatinine, Ser: 0.88 mg/dL (ref 0.61–1.24)
Glucose, Bld: 149 mg/dL — ABNORMAL HIGH (ref 65–99)
POTASSIUM: 4.3 mmol/L (ref 3.5–5.1)
Sodium: 133 mmol/L — ABNORMAL LOW (ref 135–145)

## 2015-02-24 LAB — GLUCOSE, CAPILLARY
GLUCOSE-CAPILLARY: 137 mg/dL — AB (ref 65–99)
GLUCOSE-CAPILLARY: 184 mg/dL — AB (ref 65–99)
Glucose-Capillary: 107 mg/dL — ABNORMAL HIGH (ref 65–99)
Glucose-Capillary: 166 mg/dL — ABNORMAL HIGH (ref 65–99)

## 2015-02-24 LAB — PH, BODY FLUID: PH, FLUID: 8

## 2015-02-24 MED ORDER — IOHEXOL 350 MG/ML SOLN
75.0000 mL | Freq: Once | INTRAVENOUS | Status: AC | PRN
  Administered 2015-02-24: 75 mL via INTRAVENOUS

## 2015-02-24 MED ORDER — SODIUM CHLORIDE 0.9 % IV SOLN
INTRAVENOUS | Status: DC
  Administered 2015-02-24: 19:00:00 via INTRAVENOUS
  Filled 2015-02-24: qty 1000

## 2015-02-24 NOTE — Consult Note (Signed)
Name: Donald Knox MRN: [id number] DOB: [birth year]    ADMISSION DATE:  02/22/2015 CONSULTATION DATE:  02/24/15  REFERRING MD :  Tat/ [facility]  CHIEF COMPLAINT:  Short of breath  BRIEF PATIENT DESCRIPTION: 54 yo M non-smoker with dyspnea and large bloody R pleural effusion  SIGNIFICANT EVENTS    STUDIES:     HISTORY OF PRESENT ILLNESS:  54 yo Hispanic M nonsmoker with DM2, HBP, working as a Arboriculturist. Interviewed with family in room. Now describes awareness of increased dyspnea and diaphoresis during normal indoor work, starting 8 days ago. Light cough with clear phlegm. He had otherwise felt well, denying trauma, fever, nodes, or blood. He has been on a diet per his PCP and has lost 14 lbs in last month or so. He drove to [place] and back in June, and to beach in July without incident. He denies bleeding or leg discomfort, but has long standing mild swelling in legs w/o hx clot.  PAST MEDICAL HISTORY :   has a past medical history of Diabetes mellitus without complication and Essential hypertension (02/23/2015).  has no past surgical history on file. Prior to Admission medications   Medication Sig Start Date End Date Taking? Authorizing Provider  atorvastatin (LIPITOR) 40 MG tablet Take 40 mg by mouth daily. 02/13/15  Yes Historical Provider, MD  metFORMIN (GLUCOPHAGE-XR) 500 MG 24 hr tablet Take 1,000 mg by mouth 2 (two) times daily with a meal. 02/08/15  Yes Historical Provider, MD  ramipril (ALTACE) 2.5 MG capsule Take 2.5 mg by mouth daily. To protect kidneys 02/08/15  Yes Historical Provider, MD   No Known Allergies  FAMILY HISTORY:  family history is not on file. SOCIAL HISTORY:  reports that he has never smoked. He does not have any smokeless tobacco history on file. He reports that he drinks alcohol. He reports that he does not use illicit drugs.  REVIEW OF SYSTEMS:  += positive Constitutional: Negative for fever, chills, +weight loss, malaise/fatigue and +diaphoresis.    HENT: Negative for hearing loss, ear pain, nosebleeds, congestion, sore throat, neck pain, tinnitus and ear discharge.   Eyes: Negative for blurred vision, double vision, photophobia, pain, discharge and redness.  Respiratory: Negative for +cough, hemoptysis, sputum production, +shortness of breath, wheezing and stridor.   Cardiovascular: Negative for chest pain, palpitations, orthopnea, claudication, leg swelling and PND.  Gastrointestinal: Negative for heartburn, nausea, vomiting, abdominal pain, diarrhea, constipation, blood in stool and melena.  Genitourinary: Negative for dysuria, urgency, frequency, hematuria and flank pain.  Musculoskeletal: Negative for myalgias, back pain, joint pain and falls.  Skin: Negative for itching and rash.  Neurological: Negative for dizziness, tingling, tremors, sensory change, speech change, focal weakness, seizures, loss of consciousness, weakness and headaches.  Endo/Heme/Allergies: Negative for environmental allergies and polydipsia. Does not bruise/bleed easily.  SUBJECTIVE:   VITAL SIGNS: Temp:  [97.9 F (36.6 C)-98.4 F (36.9 C)] 98.4 F (36.9 C) (07/24 0512) Pulse Rate:  [58-83] 58 (07/24 0512) Resp:  [18] 18 (07/24 0512) BP: (110-131)/(71-83) 110/71 mmHg (07/24 0512) SpO2:  [95 %-96 %] 96 % (07/24 0512)  PHYSICAL EXAMINATION: General:  Pleasant, cheerful man, up in room with family Neuro:  Grossly normal, non-focal, oriented and ambulatory HEENT:  Speech clear, no stridor, mucosa normal Cardiovascular: RRR, no m/g/r Lungs:  Mild dullness lower Right without rub, no cough or wheeze Abdomen: overweight Musculoskeletal:  Normal symmetrical muscle bulk. Homan's negative Skin:  No rash or bruising Nodes: none found   Recent Labs Lab 02/22/15 2036 02/23/15  0222 02/24/15 0320  NA 137 136 133*  K 3.9 4.0 4.3  CL 100* 102 99*  CO2 25 25 27   BUN 11 9 14   CREATININE 1.07 0.90 0.88  GLUCOSE 128* 161* 149*    Recent Labs Lab  02/22/15 2036 02/23/15 0222  HGB 13.8 12.8*  HCT 41.0 37.9*  WBC 8.0 6.3  PLT 238 218   Dg Chest 1 View  02/23/2015   CLINICAL DATA:  Status post right thoracentesis.  EXAM: CHEST  1 VIEW  COMPARISON:  02/22/2015  FINDINGS: There is significant reduction in right pleural fluid following thoracentesis. A small amount of fluid remains. Underlying opacity in the right lower lung may represent residual atelectasis versus infiltrate. No edema or pneumothorax identified. There is atelectasis at the left lung base. No left-sided pleural fluid identified. The heart size is within normal limits.  IMPRESSION: Reduction right pleural fluid volume following thoracentesis with no evidence of pneumothorax. Underlying opacity in the right lower lung is consistent with atelectasis versus infiltrate.   Electronically Signed   By: Irish Lack M.D.   On: 02/23/2015 13:35   Dg Chest 2 View  02/22/2015   CLINICAL DATA:  Acute onset of shortness of breath and wheezing. Initial encounter.  EXAM: CHEST  2 VIEW  COMPARISON:  Chest radiograph performed 05/29/2011  FINDINGS: The lungs are well-aerated. A small right pleural effusion is noted, with associated airspace opacification. Underlying vascular congestion is noted. This may reflect mild asymmetric interstitial edema or possibly pneumonia. There is no evidence of pneumothorax.  The heart is borderline normal in size. No acute osseous abnormalities are seen.  IMPRESSION: Small right pleural effusion with associated airspace opacification. Underlying vascular congestion noted. This may reflect mild asymmetric interstitial edema or possibly pneumonia. Followup PA and lateral chest X-ray is recommended in 3-4 weeks following trial of antibiotic therapy to ensure resolution and exclude underlying malignancy.   Electronically Signed   By: Roanna Raider M.D.   On: 02/22/2015 21:23   Ct Chest W Contrast  02/23/2015   CLINICAL DATA:  54 year old male with shortness of breath  and wheezing  EXAM: CT CHEST WITH CONTRAST  TECHNIQUE: Multidetector CT imaging of the chest was performed during intravenous contrast administration.  CONTRAST:  75mL OMNIPAQUE IOHEXOL 300 MG/ML  SOLN  COMPARISON:  Chest radiograph dated 02/22/2015 and CT dated 06/02/2011  FINDINGS: There is a large right-sided pleural effusion with associated partial compressive atelectasis of the right upper and right lung. An area of consolidation with air bronchogram in the right lung base may represent atelectatic changes. Pneumonia or underlying mass is not excluded. Clinical correlation and follow-up recommended. The left lung is clear. There is no pneumothorax. The central airways are patent.  The visualized thoracic aorta appears unremarkable. The pulmonary trunk and central pulmonary arteries are patent. There is no adenopathy. No cardiomegaly or pericardial effusion. The visualized esophagus and thyroid gland appear unremarkable.  The chest wall is unremarkable. There is mild degenerative changes of the thoracic spine. No acute fracture. The visualized upper abdomen appear unremarkable.  There is mild haziness of the upper abdominal mesentery with multiple top-normal lymph nodes with a "misty mesentery" appearance. This finding is nonspecific but may be related to underlying inflammatory/infectious etiology. Small splenic hypodense lesions are not well characterized but may represent hemangioma.  IMPRESSION: Large possibly loculated right pleural effusion with compressive atelectasis of right lung. Superimposed pneumonia loose underlying mass is not excluded. Clinical correlation and follow-up recommended. No pneumothorax.  Electronically Signed   By: Elgie Collard M.D.   On: 02/23/2015 00:06   US Thoracentesis Asp Pleural Space W/img Guide  02/23/2015   Irish Lack, MD     02/23/2015 12:57 PM Interventional Radiology Procedure Note  Procedure:  Ultrasound guided right thoracentesis  Complications:  None   Estimated Blood Loss: <10 mL  Findings:  Grossly bloody right pleural fluid, dark blood.  1.4 L  removed.  Post CXR pending  Glenn T. Fredia Sorrow, M.D Pager:  [phone]     I reviewed latest images  ASSESSMENT / PLAN:  Pleural effusion- bloody pleural effusion indicates serious pathology until proven otherwise. There is no history of coagulopathy, anti-coagulant therapy or trauma. First considerations after his driving would be PE with infarction, Cancer. Cytopath won't be back probably before 7/26. Discussed potential value/ risk of doing CTa, with further dye exposure in this diabetic, vs leg dopplers.. The first study did not see pulmonary vascular clot or definite mass.  He will need follow-up till resolution. VATS/ thoracoscopy may be considered, depending on path from fluid, if this doesn't resolve.   Pulmonary and Critical Care Medicine [facility] Pager: [phone]  02/24/2015, 1:15 PM

## 2015-02-24 NOTE — Progress Notes (Signed)
PROGRESS NOTE  Clancy Cadman MRN:[id number] DOB: Dec 09, 1960 DOA: 02/22/2015 PCP: Burkhart,Brian, MD  Brief history 54 year old gentleman with a history of diabetes mellitus, hypertension, hyperlipidemia presents with one-week history of worsening shortness of breath and dyspnea on exertion. The patient denies any fevers, chills, chest discomfort. 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 The patient recently had a long drive to [place] (16hr) and back in mid June 2016. CT of the chest revealed a large right-sided pleural loculated effusion. WBC was 8.0, presentation. The patient has a previous history of tobacco smoking over 20 pack years. He quit 10 years ago. He denies any other occupational exposures. There's been no hemoptysis, nausea, vomiting, diarrhea. Interventional radiology was consulted for thoracocentesis. Of note, the patient states that he has had his first voice throughout his life without any changes. He denies any involuntary weight loss. Assessment/Plan: Exudative Pleural effusion -IR for thoracocentesis-->1.4L blood fluid -Gram stain and culture--neg to date - exudative -pt received abx 7/22 which may compromise cutlure -case discussed with pulmonary--Dr. Young-->CTA chest r/o PE and to get another look at parenchyma post thoracocentesis -Await cytology -start IVF as pt will get another dye load Diabetes mellitus type 2 -Hemoglobin A1c--pending -NovoLog sliding scale -Discontinue metformin Hypertension -Controlled off of ramipril Hyperlipidemia -Continue atorvastatin  Family Communication: Son updated at beside Disposition Plan: Home when medically stable    Procedures/Studies: Dg Chest 1 View  02/23/2015   CLINICAL DATA:  Status post right thoracentesis.  EXAM: CHEST  1 VIEW  COMPARISON:  02/22/2015  FINDINGS: There is significant reduction in right pleural fluid following thoracentesis. A small amount of fluid remains.  Underlying opacity in the right lower lung may represent residual atelectasis versus infiltrate. No edema or pneumothorax identified. There is atelectasis at the left lung base. No left-sided pleural fluid identified. The heart size is within normal limits.  IMPRESSION: Reduction right pleural fluid volume following thoracentesis with no evidence of pneumothorax. Underlying opacity in the right lower lung is consistent with atelectasis versus infiltrate.   Electronically Signed   By: Glenn  Yamagata M.D.   On: 02/23/2015 13:35   Dg Chest 2 View  02/22/2015   CLINICAL DATA:  Acute onset of shortness of breath and wheezing. Initial encounter.  EXAM: CHEST  2 VIEW  COMPARISON:  Chest radiograph performed 05/29/2011  FINDINGS: The lungs are well-aerated. A small right pleural effusion is noted, with associated airspace opacification. Underlying vascular congestion is noted. This may reflect mild asymmetric interstitial edema or possibly pneumonia. There is no evidence of pneumothorax.  The heart is borderline normal in size. No acute osseous abnormalities are seen.  IMPRESSION: Small right pleural effusion with associated airspace opacification. Underlying vascular congestion noted. This may reflect mild asymmetric interstitial edema or possibly pneumonia. Followup PA and lateral chest X-ray is recommended in 3-4 weeks following trial of antibiotic therapy to ensure resolution and exclude underlying malignancy.   Electronically Signed   By: Jeffery  Chang M.D.   On: 02/22/2015 21:23   Ct Chest W Contrast  02/23/2015   CLINICAL DATA:  54 year old male with shortness of breath and wheezing  EXAM: CT CHEST WITH CONTRAST  TECHNIQUE: Multidetector CT imaging of the chest was performed during intravenous contrast administration.  CONTRAST:  [id number] OMNIPAQUE IOHEXOL 300 MG/ML  SOLN  COMPARISON:  Chest radiograph dated 02/22/2015 and CT dated 06/02/2011  FINDINGS: There is a large right-sided pleural effusion with  associated partial  compressive atelectasis of the right upper and right lung. An area of consolidation with air bronchogram in the right lung base may represent atelectatic changes. Pneumonia or underlying mass is not excluded. Clinical correlation and follow-up recommended. The left lung is clear. There is no pneumothorax. The central airways are patent.  The visualized thoracic aorta appears unremarkable. The pulmonary trunk and central pulmonary arteries are patent. There is no adenopathy. No cardiomegaly or pericardial effusion. The visualized esophagus and thyroid gland appear unremarkable.  The chest wall is unremarkable. There is mild degenerative changes of the thoracic spine. No acute fracture. The visualized upper abdomen appear unremarkable.  There is mild haziness of the upper abdominal mesentery with multiple top-normal lymph nodes with a "misty mesentery" appearance. This finding is nonspecific but may be related to underlying inflammatory/infectious etiology. Small splenic hypodense lesions are not well characterized but may represent hemangioma.  IMPRESSION: Large possibly loculated right pleural effusion with compressive atelectasis of right lung. Superimposed pneumonia loose underlying mass is not excluded. Clinical correlation and follow-up recommended. No pneumothorax.   Electronically Signed   By: Elgie Collard M.D.   On: 02/23/2015 00:06   US Thoracentesis Asp Pleural Space W/img Guide  02/23/2015   Irish Lack, MD     02/23/2015 12:57 PM Interventional Radiology Procedure Note  Procedure:  Ultrasound guided right thoracentesis  Complications:  None  Estimated Blood Loss: <10 mL  Findings:  Grossly bloody right pleural fluid, dark blood.  1.4 L  removed.  Post CXR pending  Glenn T. Fredia Sorrow, M.D Pager:  [phone]           Subjective: Patient is breathing better post thoracocentesis. Denies any fevers, chills, chest pain, shortness breath, nausea, vomiting, diarrhea, abdominal  pain, dysuria, hematuria. Medication or melena.  Objective: Filed Vitals:   02/23/15 1517 02/23/15 2024 02/24/15 0512 02/24/15 1346  BP: 124/73 131/83 110/71 131/72  Pulse: 72 83 58 77  Temp: 97.9 F (36.6 C) 98.1 F (36.7 C) 98.4 F (36.9 C) 98.1 F (36.7 C)  TempSrc: Oral Oral Oral Oral  Resp: 18 18 18 17   Height:      Weight:      SpO2: 95% 95% 96% 95%    Intake/Output Summary (Last 24 hours) at 02/24/15 1528 Last data filed at 02/24/15 1347  Gross per 24 hour  Intake    720 ml  Output      0 ml  Net    720 ml   Weight change:  Exam:   General:  Pt is alert, follows commands appropriately, not in acute distress  HEENT: No icterus, No thrush, No neck mass, [place]/AT  Cardiovascular: RRR, S1/S2, no rubs, no gallops  Respiratory: Right basilar crackles. No wheezing. Good air movement.  Abdomen: Soft/+BS, non tender, non distended, no guarding  Extremities: No edema, No lymphangitis, No petechiae, No rashes, no synovitis; no cyanosis or clubbing   Data Reviewed: Basic Metabolic Panel:  Recent Labs Lab 02/22/15 2036 02/23/15 0222 02/24/15 0320  NA 137 136 133*  K 3.9 4.0 4.3  CL 100* 102 99*  CO2 25 25 27   GLUCOSE 128* 161* 149*  BUN 11 9 14   CREATININE 1.07 0.90 0.88  CALCIUM 9.0 9.0 8.8*   Liver Function Tests:  Recent Labs Lab 02/23/15 0222  AST 22  ALT 28  ALKPHOS 60  BILITOT 0.6  PROT 7.4  ALBUMIN 3.5   No results for input(s): LIPASE, AMYLASE in the last 168 hours. No results for input(s): AMMONIA  in the last 168 hours. CBC:  Recent Labs Lab 02/22/15 2036 02/23/15 0222  WBC 8.0 6.3  NEUTROABS  --  4.8  HGB 13.8 12.8*  HCT 41.0 37.9*  MCV 96.9 94.8  PLT 238 218   Cardiac Enzymes: No results for input(s): CKTOTAL, CKMB, CKMBINDEX, TROPONINI in the last 168 hours. BNP: Invalid input(s): POCBNP CBG:  Recent Labs Lab 02/23/15 0634 02/23/15 1651 02/23/15 2111 02/24/15 0629 02/24/15 1121  GLUCAP 111* 229* 145* 137* 107*     Recent Results (from the past 240 hour(s))  Culture, blood (routine x 2) Call MD if unable to obtain prior to antibiotics being given     Status: None (Preliminary result)   Collection Time: 02/23/15  2:22 AM  Result Value Ref Range Status   Specimen Description BLOOD LEFT ANTECUBITAL  Final   Special Requests BOTTLES DRAWN AEROBIC AND ANAEROBIC 10CC  Final   Culture NO GROWTH 1 DAY  Final   Report Status PENDING  Incomplete  Culture, blood (routine x 2) Call MD if unable to obtain prior to antibiotics being given     Status: None (Preliminary result)   Collection Time: 02/23/15  2:32 AM  Result Value Ref Range Status   Specimen Description BLOOD LEFT HAND  Final   Special Requests BOTTLES DRAWN AEROBIC AND ANAEROBIC 10CC  Final   Culture NO GROWTH 1 DAY  Final   Report Status PENDING  Incomplete  Culture, body fluid-bottle     Status: None (Preliminary result)   Collection Time: 02/23/15  1:04 PM  Result Value Ref Range Status   Specimen Description FLUID RIGHT PLEURAL  Final   Special Requests BAA RIGHT LUNG  Final   Culture NO GROWTH < 24 HOURS  Final   Report Status PENDING  Incomplete     Scheduled Meds: . atorvastatin  40 mg Oral Daily  . azithromycin  500 mg Intravenous Q24H  . cefTRIAXone (ROCEPHIN)  IV  1 g Intravenous Q24H  . insulin aspart  0-5 Units Subcutaneous QHS  . insulin aspart  0-9 Units Subcutaneous TID WC   Continuous Infusions:    Tyshun Tuckerman, DO  [facility] Pager [phone]  If 7PM-7AM, please contact night-coverage [email or web address] Password [email or web address] 02/24/2015, 3:28 PM   LOS: 1 day

## 2015-02-25 DIAGNOSIS — E119 Type 2 diabetes mellitus without complications: Secondary | ICD-10-CM

## 2015-02-25 LAB — LEGIONELLA ANTIGEN, URINE

## 2015-02-25 LAB — GRAM STAIN

## 2015-02-25 LAB — HEMOGLOBIN A1C
HEMOGLOBIN A1C: 6.2 % — AB (ref 4.8–5.6)
MEAN PLASMA GLUCOSE: 131 mg/dL

## 2015-02-25 LAB — GLUCOSE, CAPILLARY
Glucose-Capillary: 132 mg/dL — ABNORMAL HIGH (ref 65–99)
Glucose-Capillary: 126 mg/dL — ABNORMAL HIGH (ref 65–99)

## 2015-02-25 MED ORDER — CEFDINIR 300 MG PO CAPS: 300.0000 mg | ORAL_CAPSULE | Freq: Two times a day (BID) | ORAL | Status: AC

## 2015-02-25 MED ORDER — AZITHROMYCIN 500 MG PO TABS: 500.0000 mg | ORAL_TABLET | Freq: Every day | ORAL | Status: AC

## 2015-02-25 MED ORDER — AZITHROMYCIN 500 MG PO TABS
500.0000 mg | ORAL_TABLET | Freq: Every day | ORAL | Status: DC
  Filled 2015-02-25: qty 1

## 2015-02-25 NOTE — Discharge Summary (Signed)
Physician Discharge Summary  Donald Knox ZOX:[id number] DOB: Mar 05, 1961 DOA: 02/22/2015  PCP: Marylynn Pearson, MD  Admit date: 02/22/2015 Discharge date: 02/25/2015  Recommendations for Outpatient Follow-up:  1. Pt will need to follow up with PCP in 2 weeks post discharge 2. Please obtain BMP and CBC in 1-2 weeks 3. Follow up with pulmonary, Dr. Kriste Basque, on 03/06/15 @ 10AM 4. Please follow up on the cytology results of the patient's pleural fluid  Discharge Diagnoses:  Exudative Pleural effusion -IR for thoracocentesis-->1.4L blood fluid -Gram stain and culture--neg to date - exudative -pt received abx 7/22 which may compromise cutlure -case discussed with pulmonary--Dr. Young-->CTA chest r/o PE and to get another look at parenchyma post thoracocentesis -02/27/2015 CT angiogram chest negative for pulmonary embolus; there is patchy consolidation/atelectasis in the right base with no appreciable adenopathy.  -Await cytology -Patient received intravenous fluid prophylactically as a result of the patient requiring another CT of the chest with contrast  -Patient will go home with azithromycin 500 mg for 3 additional days to complete 5 days and cefdinir for 4 additional days to complete 7 days Diabetes mellitus type 2 -Hemoglobin A1c--6.2 -NovoLog sliding scale -As a result of the patient receiving intravenous dye, he was instructed not to restart his metformin until 02/27/2015 Hypertension -Controlled off of ramipril -He will resume her enalapril after discharge for renal prophylaxis  Hyperlipidemia -Continue atorvastatin  Discharge Condition: stable  Disposition: home Follow-up Information    Follow up with Michele Mcalpine, MD On 03/06/2015.   Specialty:  Pulmonary Disease   Why:  10am - [facility]   Contact information:   [address] [place] [address] [phone]       Follow up with Marylynn Pearson, MD In 2 weeks.   Specialty:  Family Medicine   Contact  information:   [address] [place] [address] [phone]       Diet:carb modified Wt Readings from Last 3 Encounters:  02/25/15 93.668 kg (206 lb 8 oz)    History of present illness:  54 year old gentleman with a history of diabetes mellitus, hypertension, hyperlipidemia presents with one-week history of worsening shortness of breath and dyspnea on exertion. The patient denies any fevers, chills, chest discomfort. 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 The patient recently had a long drive to [place] (41LK) and back in mid June 2016. CT of the chest revealed a large right-sided pleural loculated effusion. WBC was 8.0, presentation. The patient has a previous history of tobacco smoking over 20 pack years. He quit 10 years ago. He denies any other occupational exposures. There's been no hemoptysis, nausea, vomiting, diarrhea. Interventional radiology was consulted for thoracocentesis. Of note, the patient states that he has had his first voice throughout his life without any changes. He denies any involuntary weight loss.  Consultants: pulmonary  Discharge Exam: Filed Vitals:   02/25/15 0524  BP: 116/70  Pulse: 59  Temp: 98.1 F (36.7 C)  Resp: 17   Filed Vitals:   02/24/15 0512 02/24/15 1346 02/24/15 1948 02/25/15 0524  BP: 110/71 131/72 120/63 116/70  Pulse: 58 77 75 59  Temp: 98.4 F (36.9 C) 98.1 F (36.7 C) 99 F (37.2 C) 98.1 F (36.7 C)  TempSrc: Oral Oral Oral Oral  Resp: 18 17 17 17   Height:      Weight:    93.668 kg (206 lb 8 oz)  SpO2: 96% 95% 94% 97%   General: A&O x 3, NAD, pleasant, cooperative Cardiovascular: RRR,  no rub, no gallop, no S3 Respiratory: Bibasilar crackles, right greater than left. No wheezing.  Abdomen:soft, nontender, nondistended, positive bowel sounds Extremities: No edema, No lymphangitis, no petechiae  Discharge Instructions      Discharge Instructions    Diet - low sodium heart healthy    Complete  by:  As directed      Discharge instructions    Complete by:  As directed   Do NOT start metformin until Wednesday 02/27/15     Increase activity slowly    Complete by:  As directed             Medication List    TAKE these medications        atorvastatin 40 MG tablet  Commonly known as:  LIPITOR  Take 40 mg by mouth daily.     azithromycin 500 MG tablet  Commonly known as:  ZITHROMAX  Take 1 tablet (500 mg total) by mouth at bedtime.     cefdinir 300 MG capsule  Commonly known as:  OMNICEF  Take 1 capsule (300 mg total) by mouth 2 (two) times daily.     metFORMIN 500 MG 24 hr tablet  Commonly known as:  GLUCOPHAGE-XR  Take 1,000 mg by mouth 2 (two) times daily with a meal.     ramipril 2.5 MG capsule  Commonly known as:  ALTACE  Take 2.5 mg by mouth daily. To protect kidneys         The results of significant diagnostics from this hospitalization (including imaging, microbiology, ancillary and laboratory) are listed below for reference.    Significant Diagnostic Studies: Dg Chest 1 View  02/23/2015   CLINICAL DATA:  Status post right thoracentesis.  EXAM: CHEST  1 VIEW  COMPARISON:  02/22/2015  FINDINGS: There is significant reduction in right pleural fluid following thoracentesis. A small amount of fluid remains. Underlying opacity in the right lower lung may represent residual atelectasis versus infiltrate. No edema or pneumothorax identified. There is atelectasis at the left lung base. No left-sided pleural fluid identified. The heart size is within normal limits.  IMPRESSION: Reduction right pleural fluid volume following thoracentesis with no evidence of pneumothorax. Underlying opacity in the right lower lung is consistent with atelectasis versus infiltrate.   Electronically Signed   By: Irish Lack M.D.   On: 02/23/2015 13:35   Dg Chest 2 View  02/22/2015   CLINICAL DATA:  Acute onset of shortness of breath and wheezing. Initial encounter.  EXAM: CHEST  2 VIEW   COMPARISON:  Chest radiograph performed 05/29/2011  FINDINGS: The lungs are well-aerated. A small right pleural effusion is noted, with associated airspace opacification. Underlying vascular congestion is noted. This may reflect mild asymmetric interstitial edema or possibly pneumonia. There is no evidence of pneumothorax.  The heart is borderline normal in size. No acute osseous abnormalities are seen.  IMPRESSION: Small right pleural effusion with associated airspace opacification. Underlying vascular congestion noted. This may reflect mild asymmetric interstitial edema or possibly pneumonia. Followup PA and lateral chest X-ray is recommended in 3-4 weeks following trial of antibiotic therapy to ensure resolution and exclude underlying malignancy.   Electronically Signed   By: Roanna Raider M.D.   On: 02/22/2015 21:23   Ct Chest W Contrast  02/23/2015   CLINICAL DATA:  54 year old male with shortness of breath and wheezing  EXAM: CT CHEST WITH CONTRAST  TECHNIQUE: Multidetector CT imaging of the chest was performed during intravenous contrast administration.  CONTRAST:  75mL OMNIPAQUE  IOHEXOL 300 MG/ML  SOLN  COMPARISON:  Chest radiograph dated 02/22/2015 and CT dated 06/02/2011  FINDINGS: There is a large right-sided pleural effusion with associated partial compressive atelectasis of the right upper and right lung. An area of consolidation with air bronchogram in the right lung base may represent atelectatic changes. Pneumonia or underlying mass is not excluded. Clinical correlation and follow-up recommended. The left lung is clear. There is no pneumothorax. The central airways are patent.  The visualized thoracic aorta appears unremarkable. The pulmonary trunk and central pulmonary arteries are patent. There is no adenopathy. No cardiomegaly or pericardial effusion. The visualized esophagus and thyroid gland appear unremarkable.  The chest wall is unremarkable. There is mild degenerative changes of the  thoracic spine. No acute fracture. The visualized upper abdomen appear unremarkable.  There is mild haziness of the upper abdominal mesentery with multiple top-normal lymph nodes with a "misty mesentery" appearance. This finding is nonspecific but may be related to underlying inflammatory/infectious etiology. Small splenic hypodense lesions are not well characterized but may represent hemangioma.  IMPRESSION: Large possibly loculated right pleural effusion with compressive atelectasis of right lung. Superimposed pneumonia loose underlying mass is not excluded. Clinical correlation and follow-up recommended. No pneumothorax.   Electronically Signed   By: Elgie Collard M.D.   On: 02/23/2015 00:06   Ct Angio Chest Pe W/cm &/or Wo Cm  02/24/2015   CLINICAL DATA:  Shortness of breath and chest pain  EXAM: CT ANGIOGRAPHY CHEST WITH CONTRAST  TECHNIQUE: Multidetector CT imaging of the chest was performed using the standard protocol during bolus administration of intravenous contrast. Multiplanar CT image reconstructions and MIPs were obtained to evaluate the vascular anatomy.  CONTRAST:  75mL OMNIPAQUE IOHEXOL 350 MG/ML SOLN  COMPARISON:  Chest radiograph February 23, 2015 and chest CT February 22, 2015  FINDINGS: There is no demonstrable pulmonary embolus. There is no thoracic aortic aneurysm or dissection.  Right pleural effusion is significantly smaller following right-sided thoracentesis. There remains a fairly small right pleural effusion which is predominantly free-flowing but with a small amount of loculation. There is patchy airspace consolidation and atelectasis in the right base. There is mild atelectasis in the anterior left and right lung bases as well. Lungs elsewhere are clear.  There is fairly extensive mediastinal fat. There is mild left ventricular hypertrophy. Pericardium is not thickened.  There are small mediastinal lymph nodes but no lymph nodes meeting size criteria for pathologic significance.  Visualized thyroid appears normal.  In the visualized upper abdomen, no lesion is appreciable. The haziness in the upper abdominal mesenteric described 2 days prior was not imaged on this study.  There are no blastic or lytic bone lesions.  Review of the MIP images confirms the above findings.  IMPRESSION: No demonstrable pulmonary embolus. Right effusion smaller following thoracentesis. There is patchy consolidation atelectasis in the right base. Areas of atelectasis more anteriorly in both lower lobes identified. No appreciable adenopathy. Extensive mediastinal fat, a finding of questionable significance. Left ventricular hypertrophy present.   Electronically Signed   By: Bretta Bang III M.D.   On: 02/24/2015 16:55   US Thoracentesis Asp Pleural Space W/img Guide  02/23/2015   Irish Lack, MD     02/23/2015 12:57 PM Interventional Radiology Procedure Note  Procedure:  Ultrasound guided right thoracentesis  Complications:  None  Estimated Blood Loss: <10 mL  Findings:  Grossly bloody right pleural fluid, dark blood.  1.4 L  removed.  Post CXR pending  Glenn T. Fredia Sorrow,  M.D Pager:  [phone]       Microbiology: Recent Results (from the past 240 hour(s))  Culture, blood (routine x 2) Call MD if unable to obtain prior to antibiotics being given     Status: None (Preliminary result)   Collection Time: 02/23/15  2:22 AM  Result Value Ref Range Status   Specimen Description BLOOD LEFT ANTECUBITAL  Final   Special Requests BOTTLES DRAWN AEROBIC AND ANAEROBIC 10CC  Final   Culture NO GROWTH 2 DAYS  Final   Report Status PENDING  Incomplete  Culture, blood (routine x 2) Call MD if unable to obtain prior to antibiotics being given     Status: None (Preliminary result)   Collection Time: 02/23/15  2:32 AM  Result Value Ref Range Status   Specimen Description BLOOD LEFT HAND  Final   Special Requests BOTTLES DRAWN AEROBIC AND ANAEROBIC 10CC  Final   Culture NO GROWTH 2 DAYS  Final   Report Status  PENDING  Incomplete  Culture, body fluid-bottle     Status: None (Preliminary result)   Collection Time: 02/23/15  1:04 PM  Result Value Ref Range Status   Specimen Description FLUID RIGHT PLEURAL  Final   Special Requests BAA RIGHT LUNG  Final   Culture NO GROWTH 2 DAYS  Final   Report Status PENDING  Incomplete     Labs: Basic Metabolic Panel:  Recent Labs Lab 02/22/15 2036 02/23/15 0222 02/24/15 0320  NA 137 136 133*  K 3.9 4.0 4.3  CL 100* 102 99*  CO2 25 25 27   GLUCOSE 128* 161* 149*  BUN 11 9 14   CREATININE 1.07 0.90 0.88  CALCIUM 9.0 9.0 8.8*   Liver Function Tests:  Recent Labs Lab 02/23/15 0222  AST 22  ALT 28  ALKPHOS 60  BILITOT 0.6  PROT 7.4  ALBUMIN 3.5   No results for input(s): LIPASE, AMYLASE in the last 168 hours. No results for input(s): AMMONIA in the last 168 hours. CBC:  Recent Labs Lab 02/22/15 2036 02/23/15 0222  WBC 8.0 6.3  NEUTROABS  --  4.8  HGB 13.8 12.8*  HCT 41.0 37.9*  MCV 96.9 94.8  PLT 238 218   Cardiac Enzymes: No results for input(s): CKTOTAL, CKMB, CKMBINDEX, TROPONINI in the last 168 hours. BNP: Invalid input(s): POCBNP CBG:  Recent Labs Lab 02/24/15 1121 02/24/15 1614 02/24/15 2126 02/25/15 0631 02/25/15 1124  GLUCAP 107* 184* 166* 132* 126*    Time coordinating discharge:  Greater than 30 minutes  Signed:  Korrin Waterfield, DO [facility] Pager: [phone] 02/25/2015, 12:56 PM

## 2015-02-25 NOTE — Consult Note (Signed)
Name: Donald Knox MRN: [id number] DOB: Dec 07, 1960    ADMISSION DATE:  02/22/2015 CONSULTATION DATE:  02/24/15  REFERRING MD :  Tat/ [facility]  CHIEF COMPLAINT:  Short of breath  BRIEF PATIENT DESCRIPTION: 54 yo M non-smoker with dyspnea and large bloody R pleural effusion.  SIGNIFICANT EVENTS    STUDIES:  CTA 7/24 > No PE identified, R effusion - smaller after thora. ATX bilateral R>L. LVH  SUBJECTIVE: No complaints of acute events overnight  VITAL SIGNS: Temp:  [98.1 F (36.7 C)-99 F (37.2 C)] 98.1 F (36.7 C) (07/25 0524) Pulse Rate:  [59-77] 59 (07/25 0524) Resp:  [17] 17 (07/25 0524) BP: (116-131)/(63-72) 116/70 mmHg (07/25 0524) SpO2:  [94 %-97 %] 97 % (07/25 0524) Weight:  [93.668 kg (206 lb 8 oz)] 93.668 kg (206 lb 8 oz) (07/25 0524)  PHYSICAL EXAMINATION:  General:  Pleasant, cheerful man, up in room with family Neuro:  Grossly normal, non-focal, oriented and ambulatory HEENT:  Speech clear, no stridor, mucosa normal Cardiovascular: RRR, no m/g/r Lungs:  Mild dullness lower Right without rub, no cough or wheeze Abdomen: overweight Musculoskeletal:  Normal symmetrical muscle bulk.  Skin:  No rash or bruising   Recent Labs Lab 02/22/15 2036 02/23/15 0222 02/24/15 0320  NA 137 136 133*  K 3.9 4.0 4.3  CL 100* 102 99*  CO2 BUN CREATININE 1.07 0.90 0.88  GLUCOSE 128* 161* 149*    Recent Labs Lab 02/22/15 2036 02/23/15 0222  HGB 13.8 12.8*  HCT 41.0 37.9*  WBC 8.0 6.3  PLT 238 218   Dg Chest 1 View  02/23/2015   CLINICAL DATA:  Status post right thoracentesis.  EXAM: CHEST  1 VIEW  COMPARISON:  02/22/2015  FINDINGS: There is significant reduction in right pleural fluid following thoracentesis. A small amount of fluid remains. Underlying opacity in the right lower lung may represent residual atelectasis versus infiltrate. No edema or pneumothorax identified. There is atelectasis at the left lung base. No left-sided pleural fluid  identified. The heart size is within normal limits.  IMPRESSION: Reduction right pleural fluid volume following thoracentesis with no evidence of pneumothorax. Underlying opacity in the right lower lung is consistent with atelectasis versus infiltrate.   Electronically Signed   By: Irish Lack M.D.   On: 02/23/2015 13:35   Ct Angio Chest Pe W/cm &/or Wo Cm  02/24/2015   CLINICAL DATA:  Shortness of breath and chest pain  EXAM: CT ANGIOGRAPHY CHEST WITH CONTRAST  TECHNIQUE: Multidetector CT imaging of the chest was performed using the standard protocol during bolus administration of intravenous contrast. Multiplanar CT image reconstructions and MIPs were obtained to evaluate the vascular anatomy.  CONTRAST:  75mL OMNIPAQUE IOHEXOL 350 MG/ML SOLN  COMPARISON:  Chest radiograph February 23, 2015 and chest CT February 22, 2015  FINDINGS: There is no demonstrable pulmonary embolus. There is no thoracic aortic aneurysm or dissection.  Right pleural effusion is significantly smaller following right-sided thoracentesis. There remains a fairly small right pleural effusion which is predominantly free-flowing but with a small amount of loculation. There is patchy airspace consolidation and atelectasis in the right base. There is mild atelectasis in the anterior left and right lung bases as well. Lungs elsewhere are clear.  There is fairly extensive mediastinal fat. There is mild left ventricular hypertrophy. Pericardium is not thickened.  There are small mediastinal lymph nodes but no lymph nodes meeting size criteria for pathologic significance. Visualized thyroid appears  normal.  In the visualized upper abdomen, no lesion is appreciable. The haziness in the upper abdominal mesenteric described 2 days prior was not imaged on this study.  There are no blastic or lytic bone lesions.  Review of the MIP images confirms the above findings.  IMPRESSION: No demonstrable pulmonary embolus. Right effusion smaller following  thoracentesis. There is patchy consolidation atelectasis in the right base. Areas of atelectasis more anteriorly in both lower lobes identified. No appreciable adenopathy. Extensive mediastinal fat, a finding of questionable significance. Left ventricular hypertrophy present.   Electronically Signed   By: Bretta Bang III M.D.   On: 02/24/2015 16:55   US Thoracentesis Asp Pleural Space W/img Guide  02/23/2015   Irish Lack, MD     02/23/2015 12:57 PM Interventional Radiology Procedure Note  Procedure:  Ultrasound guided right thoracentesis  Complications:  None  Estimated Blood Loss: <10 mL  Findings:  Grossly bloody right pleural fluid, dark blood.  1.4 L  removed.  Post CXR pending  Glenn T. Fredia Sorrow, M.D Pager:  [phone]      ASSESSMENT / PLAN:  Pleural effusion- bloody pleural effusion indicates serious pathology until proven otherwise. There is no history of coagulopathy, anti-coagulant therapy or trauma. ddx includes PE with infarction and Cancer. > CTA chest 7/24 negative for PE. Bibasilar ATX  Plan: - Await pleural fluid studies - Routine incentive spirometry - Wants to go home today, would be OK from pulmonary standpoint to do this - Pulmonary follow up arranged for 8/3 with Dr. Kriste Basque  He will need follow-up till resolution. VATS/ thoracoscopy may be considered, depending on path from fluid, if this doesn't resolve.  Joneen Roach, AGACNP-BC [facility] Pulmonology/Critical Care Pager [phone] or [phone]  02/25/2015 10:20 AM

## 2015-02-25 NOTE — Progress Notes (Signed)
UR Completed. Alzena Gerber, RN, BSN.  [phone] 

## 2015-02-28 LAB — CULTURE, BODY FLUID W GRAM STAIN -BOTTLE: Culture: NO GROWTH

## 2015-02-28 LAB — CULTURE, BLOOD (ROUTINE X 2)
CULTURE: NO GROWTH
CULTURE: NO GROWTH

## 2015-03-06 ENCOUNTER — Other Ambulatory Visit (INDEPENDENT_AMBULATORY_CARE_PROVIDER_SITE_OTHER): Payer: BC Managed Care – PPO

## 2015-03-06 ENCOUNTER — Encounter: Payer: Self-pay | Admitting: Pulmonary Disease

## 2015-03-06 ENCOUNTER — Ambulatory Visit (INDEPENDENT_AMBULATORY_CARE_PROVIDER_SITE_OTHER): Payer: BC Managed Care – PPO | Admitting: Pulmonary Disease

## 2015-03-06 ENCOUNTER — Ambulatory Visit (INDEPENDENT_AMBULATORY_CARE_PROVIDER_SITE_OTHER)
Admission: RE | Admit: 2015-03-06 | Discharge: 2015-03-06 | Disposition: A | Discharge status: Home / Self Care | Payer: BC Managed Care – PPO | Source: Ambulatory Visit | Attending: Pulmonary Disease | Admitting: Pulmonary Disease

## 2015-03-06 DIAGNOSIS — I10 Essential (primary) hypertension: Secondary | ICD-10-CM

## 2015-03-06 DIAGNOSIS — J948 Other specified pleural conditions: Secondary | ICD-10-CM

## 2015-03-06 DIAGNOSIS — R0600 Dyspnea, unspecified: Secondary | ICD-10-CM

## 2015-03-06 DIAGNOSIS — R011 Cardiac murmur, unspecified: Secondary | ICD-10-CM

## 2015-03-06 DIAGNOSIS — J90 Pleural effusion, not elsewhere classified: Secondary | ICD-10-CM

## 2015-03-06 DIAGNOSIS — R490 Dysphonia: Secondary | ICD-10-CM

## 2015-03-06 DIAGNOSIS — J189 Pneumonia, unspecified organism: Secondary | ICD-10-CM

## 2015-03-06 DIAGNOSIS — F419 Anxiety disorder, unspecified: Secondary | ICD-10-CM

## 2015-03-06 DIAGNOSIS — E119 Type 2 diabetes mellitus without complications: Secondary | ICD-10-CM

## 2015-03-06 LAB — SEDIMENTATION RATE: SED RATE: 38 mm/h — AB (ref 0–22)

## 2015-03-06 LAB — BRAIN NATRIURETIC PEPTIDE: PRO B NATRI PEPTIDE: 13 pg/mL (ref 0.0–100.0)

## 2015-03-06 LAB — TSH: TSH: 1.24 u[IU]/mL (ref 0.35–4.50)

## 2015-03-06 MED ORDER — ONE FLOW SPIROMETER DEVI: Status: AC

## 2015-03-06 NOTE — Progress Notes (Addendum)
Subjective:     Patient ID: Donald Knox, male   DOB: 07-08-1961, 54 y.o.   MRN: [id number]  HPI 54 y/o hispanic gentleman, referred by [facility]/ CCM for f/u from recent hospitalization (7/22 - 02/25/15) for right pleural effusion>> PCP listed as Dr. Marylynn Pearson in [place]- daughter is a PA & phone# [phone]...      Donald Knox has a hx HBP, HL, DM & presented to the ER w. 1wk hx SOB/DOE, found to have right pleural effusion but no cough/ phlegm/ hemoptysis/ fever/ CP etc;  Despite a recent car trip to [place], former smoking hx, chronically hoarse voice & no GI symptoms or weight loss=> his WBC was only 8K and NEG cultures/ strep pneumo/ legionella/ HIV; CTA was NEG for PE; Thoracentesis by IR yielded 1400cc bloody fluid w/ WBC=2500 (12N, 52Lym, , 24eos), exudate w/ TPot=5.0, LDH=688, pH=8.0, Cult=NEG, cyto=NEG;  Post-thoracentesis CXRs showed improvement but w/ persistent effusion, pleural thickening, atx & this will need to be followed (if recurrent then he will likely need TSurg for VATS and pleural clean-out/ pleurectomy)...       He has had several bouts of pneumonia in the past> states that he was Hosp 5d at [facility] ~46yrs ago & required a left chest tube to drain fluid; at that time he was asymptomatic & got a CXR routinely as required to get legal status in the [place]- he was sent to [facility] due to the left effusion, no records are avail; states that he had another "pneumonia" about 12 yrs ago- no details known; then again ~75yrs ago & this corresponds to the only other XRays in the PACS system (Xrays say he had right chest wall trauma falling OOB); he denies hx Tb or known exposure;  He has a chronically hoarse voice & indicates that he has never had this evaluated by ENT etc;  He is an ex-smoker having started in his teens, smoked for about 25 yrs up to 1ppd, and quit in 2005;  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 He says that his breathing is a lot better but notes  a "pulling" sensation on his right side- denies CP, cough/ sput/ hemoptysis/ fever/ etc- he feels that he is about back to normal...      Current Meds> finished Zithromax/ Omnicef; on Altace2.5, Atorva40, Metform500-2Bid...   EXAM reveals Afeb, VSS, O2sat=97% on RA;  HEENT- hoarse, mallampati2;  Chest- decr BS right base, few rhonchi w/o wheezing/ rales/ consolidation;  Heart- RR gr1/6 SEM w/o r/g;  Abd soft, neg;  Ext- neg w/o c/c/e...  Old CXR from 05/2011 showed sl elev left hemidiaph at baseline w/ pleural thickening bilat but NAD...  CT Chest 05/2011 (fell OOB w/ cracked right ribs seen on CT) was neg for PE, no adenop, sm right effusion/ pleural thickening/ bibasilar atx ?bronchiectasis?  No f/u films til this adm 02/22/15> mod right effusion & pleural thickening w/ bilat airspace dis R>L & vasc congestion  Subseq CT Chest 02/2015 showed large part loculated right effusion w/ compressive atx in right lung, pleural thickening on left, no adenopathy...  Thoracentesis by IR w/ 1.4L dark bloody fluid removed (see fluid studies above- marked exud w/ neg cult & neg cyto)  f/u CXRs w/ improved right aeration but persist hazy right effus/ pleural thickening & inflamm/ and elev left hemidiaph w/ pleural thickening...  Spirometry 03/06/15 showed FVC=2.40 (52%), FEV1=2.02 (55%), %1sec=84%, mid-flows are onlt min reduced at74% predicted...  LABS 03/06/15 showed Sed=38;  BNP=13;  QuantGOLD=neg  2DEcho 03/12/15 showed norm cavity size & norm LVF w/ EF=60-65%, norm wall motion, norm valves, norm RV & norm PAsys...   IMP/PLAN>>  While it is fashionable to call his right pleural effusion as parapneumonic given his presentation and neg studies for other etiologies, I think it is just idiopathic at this point ?etiology- it was a marked exudate w/ LDH=688 & dark bloody w/ NEG cytology & no evid for hidden tumor anywhere;  Interesting that only prev CXR/ CT was 2012 when he had some trauma, cracked right 8th rib  (seen on CT) but had evid of bilat pleural thickening & elev left hemidiaphragm;  No follow up films until this admission w/ large loculated right effusion, atx, etc (?chronic pleural process over these yrs?)... He is improved post thoracentesis and post-hosp but has signif residual pleural inflamm remaining & I am concerned for poss trapped lung w/ restrictive PFTs-- we will have to see how he does over time, if increasing fluid/ atx/ etc then he may need TSurg consult for pleurectomy... The other issue I am not comfortable w/ is his chronic hoarseness which has never been evaluated by ENT-- note he has chr elev of left hemidiaphragm but nothing showing up in the mediastinum on CT scans; for now we will rec antireflux regimen & refer to ENT.Donald Knox    Past Medical History >> his PCP= Dr. Marylynn Pearson  Diagnosis Date  . Diabetes mellitus without complication     Patient states he takes 2 metformin pills twice a day.   Hyperlipidemia on Atorva40   . Essential hypertension on Altace2.5 02/23/2015    No past surgical history on file.  He had prev left chest tube drainage of pleural effusion at [facility] ~30 yrs ago...   Outpatient Encounter Prescriptions as of 03/06/2015  Medication Sig  . atorvastatin (LIPITOR) 40 MG tablet Take 40 mg by mouth daily.  Donald Knox azithromycin (ZITHROMAX) 500 MG tablet Take 1 tablet (500 mg total) by mouth at bedtime.  . cefdinir (OMNICEF) 300 MG capsule Take 1 capsule (300 mg total) by mouth 2 (two) times daily.  . metFORMIN (GLUCOPHAGE-XR) 500 MG 24 hr tablet Take 1,000 mg by mouth 2 (two) times daily with a meal.  . ramipril (ALTACE) 2.5 MG capsule Take 2.5 mg by mouth daily. To protect kidneys   No facility-administered encounter medications on file as of 03/06/2015.    No Known Allergies   No family history on file.   History   Social History  . Marital Status: Married    Spouse Name: N/A  . Number of Children: N/A  . Years of Education: N/A   Occupational History   . Not on file.   Social History Main Topics  . Smoking status: Never Smoker   . Smokeless tobacco: Not on file  . Alcohol Use: Yes  . Drug Use: No  . Sexual Activity: Not on file   Other Topics Concern  . Not on file   Social History Narrative    Current Medications, Allergies, Past Medical History, Past Surgical History, Family History, and Social History were reviewed in [employer] record.   Review of Systems            All symptoms NEG except where BOLDED >>  Constitutional:  F/C/S, fatigue, anorexia, unexpected weight change. HEENT:  HA, visual changes, hearing loss, earache, nasal symptoms, sore throat, mouth sores, hoarseness. Resp:  cough, sputum, hemoptysis; SOB, tightness, wheezing. Cardio:  CP, palpit, DOE, orthopnea,  edema. GI:  N/V/D/C, blood in stool; reflux, abd pain, distention, gas. GU:  dysuria, freq, urgency, hematuria, flank pain, voiding difficulty. MS:  joint pain, swelling, tenderness, decr ROM; neck pain, back pain, etc. Neuro:  HA, tremors, seizures, dizziness, syncope, weakness, numbness, gait abn. Skin:  suspicious lesions or skin rash. Heme:  adenopathy, bruising, bleeding. Psyche:  confusion, agitation, sleep disturbance, hallucinations, anxiety, depression suicidal.   Objective:   Physical Exam      Vital Signs:  Reviewed...  General:  WD, WN, 54 y/o Latino man in NAD; alert & oriented; pleasant & cooperative... HEENT:  [place]/AT; Conjunctiva- pink, Sclera- nonicteric, EOM-wnl, PERRLA, EACs-clear, TMs-wnl; NOSE-clear; THROAT-clear & wnl. Neck:  Supple w/ fair ROM; no JVD; normal carotid impulses w/o bruits; no thyromegaly or nodules palpated; no lymphadenopathy. Chest:  Sl dull at bases bilat, decr BS R>L base, few rhonchi w/o wheezing/ rales/ or signs of consolidation... Heart:  Regular Rhythm; norm S1 & S2, gr1/6 SEM, w/o rubs or gallops detected. Abdomen:  Soft & nontender- no guarding or rebound; normal bowel sounds; no  organomegaly or masses palpated. Ext:  Normal ROM; without deformities or arthritic changes; no varicose veins, venous insuffic, or edema;  Pulses intact w/o bruits. Neuro:  No focal neuro deficits; sensory testing normal; gait normal & balance OK. Derm:  No lesions noted; no rash etc. Lymph:  No cervical, supraclavicular, axillary, or inguinal adenopathy palpated.   Assessment:      IMP >>     ?Pneumonia w/ parapneumonic effusion?     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    Chronic hoarseness ?etiology    Elev left hemidiaph    HBP on Altace2.5 w/ BP= 116/72    HL on Atorva40 followed by DrBurkhart in [place]    DM on Metform500-2Bid w/ A1c=6.2  PLAN >>  While it is fashionable to call his right pleural effusion as parapneumonic given his presentation and neg studies for other etiologies, I think it is just idiopathic at this point ?etiology- it was a marked exudate w/ LDH=688 & dark bloody w/ NEG cytology & no evid for hidden tumor anywhere;  Interesting that only prev CXR/ CT was 2012 when he had some trauma, cracked right 8th rib (seen on CT) but had evid of bilat pleural thickening & elev left hemidiaphragm;  No follow up films until this admission w/ large loculated right effusion, atx, etc (?chronic pleural process over these yrs?)... He is improved post thoracentesis and post-hosp but has signif residual pleural inflamm remaining & I am concerned for poss trapped lung w/ restrictive PFTs-- we will have to see how he does over time & try Incentive Spirometer, if increasing fluid/ atx/ etc then he may need TSurg consult for pleurectomy... The other issue I am not comfortable w/ is his chronic hoarseness which has never been evaluated by ENT-- note he has chr elev of left hemidiaphragm  but nothing showing up in the mediastinum on CT scans; for now we will rec antireflux regimen & refer to ENT     Plan:     Patient's Medications  New Prescriptions   RESPIRATORY THERAPY SUPPLIES (ONE FLOW SPIROMETER) DEVI    Use as directed  Previous Medications   ATORVASTATIN (LIPITOR) 40 MG TABLET    Take 40  mg by mouth daily.   AZITHROMYCIN (ZITHROMAX) 500 MG TABLET    Take 1 tablet (500 mg total) by mouth at bedtime.   CEFDINIR (OMNICEF) 300 MG CAPSULE    Take 1 capsule (300 mg total) by mouth 2 (two) times daily.   METFORMIN (GLUCOPHAGE-XR) 500 MG 24 HR TABLET    Take 1,000 mg by mouth 2 (two) times daily with a meal.   RAMIPRIL (ALTACE) 2.5 MG CAPSULE    Take 2.5 mg by mouth daily. To protect kidneys  Modified Medications   No medications on file  Discontinued Medications   No medications on file

## 2015-03-06 NOTE — Patient Instructions (Signed)
Adolfo- it was nice meeting you today...  We discussed your previous medical history & the recent evaluation in the hospital>>    The fluid in you right chest was a thick proteinaceous fluid called an exudate...    The cultures and cytology were all neg (no cancer found)- it could all be related to pneumonia/infection...  Today we did a breathing test, follow up CXR, and some f/u blood work...    We will contact you w/ the results when available...     Then we can decide about further evaluation vs careful follow up visits...  In addition> we will sched an echocardiogram to check on your heart murmur...  Later we will send you to an ENT specialist to check your throat/ vocal cord area regarding your hoarseness...  Call for any questions...    We will be in touch soon.Marland KitchenMarland Kitchen

## 2015-03-09 LAB — QUANTIFERON TB GOLD ASSAY (BLOOD)
INTERFERON GAMMA RELEASE ASSAY: NEGATIVE
Mitogen value: 6.53 IU/mL
QUANTIFERON NIL VALUE: 0.04 [IU]/mL
Quantiferon Tb Ag Minus Nil Value: -0.01 IU/mL
TB AG VALUE: 0.03 [IU]/mL

## 2015-03-12 ENCOUNTER — Other Ambulatory Visit: Payer: Self-pay

## 2015-03-12 ENCOUNTER — Ambulatory Visit (HOSPITAL_COMMUNITY): Payer: BC Managed Care – PPO | Attending: Cardiovascular Disease

## 2015-03-12 DIAGNOSIS — J948 Other specified pleural conditions: Secondary | ICD-10-CM | POA: Diagnosis not present

## 2015-03-12 DIAGNOSIS — I10 Essential (primary) hypertension: Secondary | ICD-10-CM | POA: Diagnosis not present

## 2015-03-12 DIAGNOSIS — F419 Anxiety disorder, unspecified: Secondary | ICD-10-CM

## 2015-03-12 DIAGNOSIS — Z87891 Personal history of nicotine dependence: Secondary | ICD-10-CM | POA: Diagnosis not present

## 2015-03-12 DIAGNOSIS — E119 Type 2 diabetes mellitus without complications: Secondary | ICD-10-CM

## 2015-03-12 DIAGNOSIS — R490 Dysphonia: Secondary | ICD-10-CM | POA: Diagnosis not present

## 2015-03-12 DIAGNOSIS — J189 Pneumonia, unspecified organism: Secondary | ICD-10-CM | POA: Diagnosis not present

## 2015-03-12 DIAGNOSIS — R011 Cardiac murmur, unspecified: Secondary | ICD-10-CM

## 2015-03-12 DIAGNOSIS — J90 Pleural effusion, not elsewhere classified: Secondary | ICD-10-CM

## 2015-03-12 DIAGNOSIS — R0600 Dyspnea, unspecified: Secondary | ICD-10-CM

## 2017-01-13 IMAGING — CT CT ANGIO CHEST
2 of 9 series · 18 of 46 positions shown · IV contrast (Omni 300)
Comparison: Chest radiograph February 23, 2015 and chest CT February 22, 2015

CLINICAL DATA: Shortness of breath and chest pain

EXAM:
CT ANGIOGRAPHY CHEST WITH CONTRAST
TECHNIQUE: Multidetector CT imaging of the chest was performed using the
standard protocol during bolus administration of intravenous
contrast. Multiplanar CT image reconstructions and MIPs were
obtained to evaluate the vascular anatomy.
CONTRAST:  75mL OMNIPAQUE IOHEXOL 350 MG/ML SOLN

[Series 5: thins · axial · 0.71mm/px · z∈[-819,-567]mm · 15 of 284 slices shown]
[im 16/284  lung]
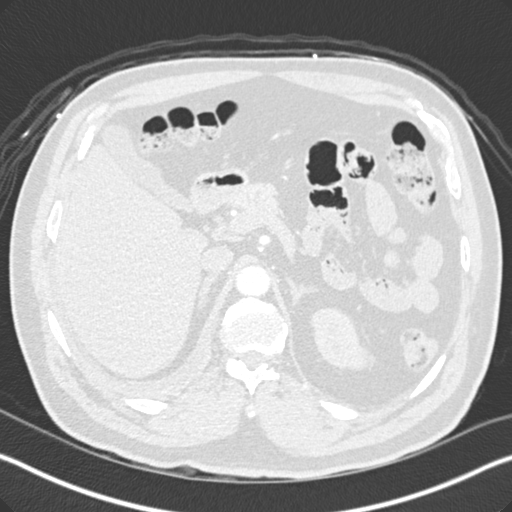
[im 32/284  soft-tissue]
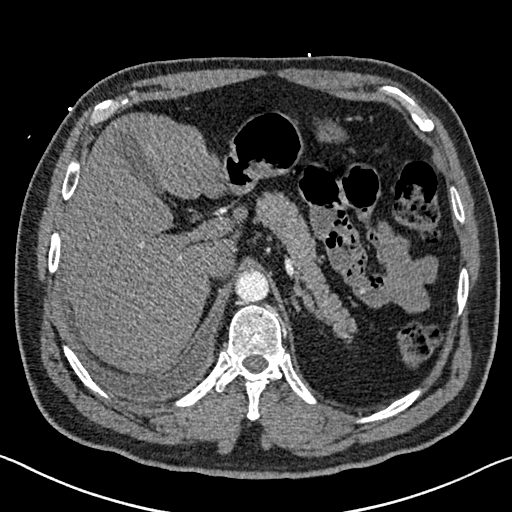
[im 48/284  lung]
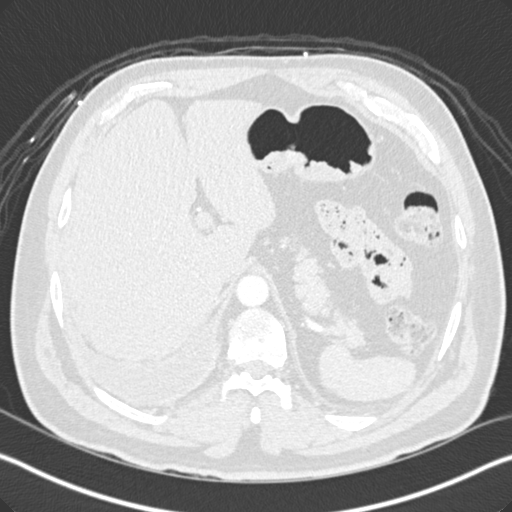
[im 63/284  soft-tissue]
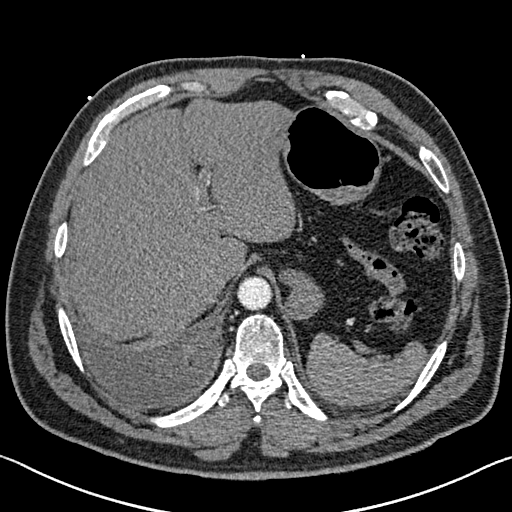
[im 95/284  lung]
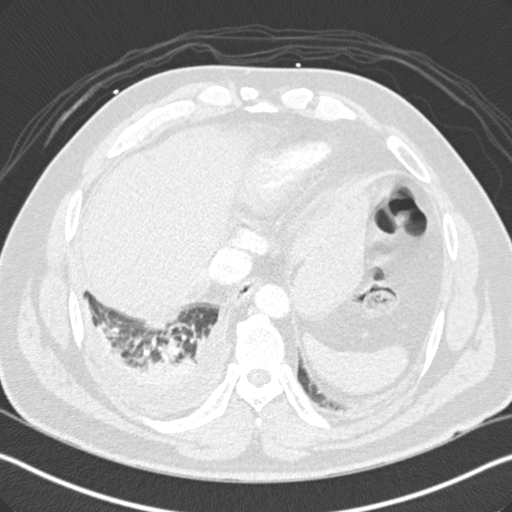
[im 111/284  soft-tissue]
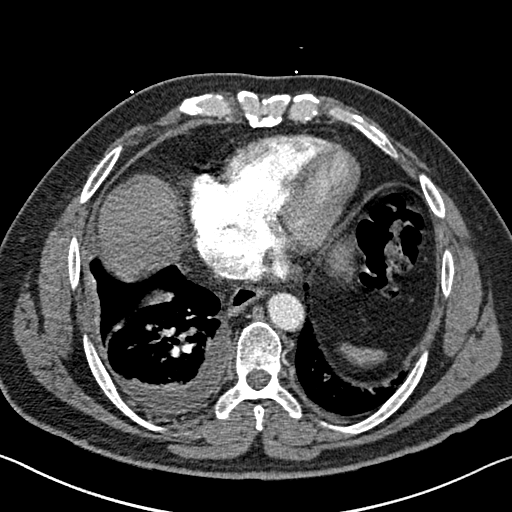
[im 126/284  lung]
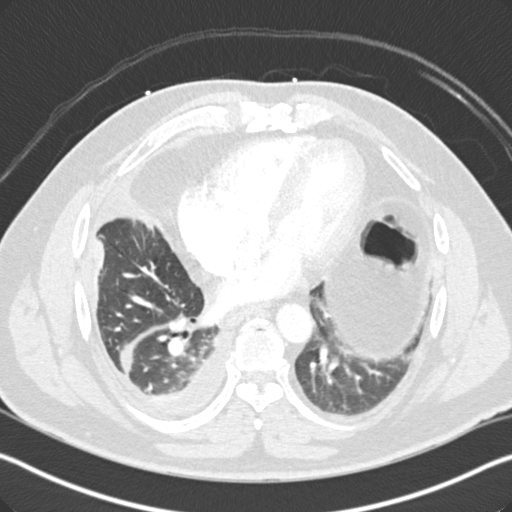
[im 142/284  soft-tissue]
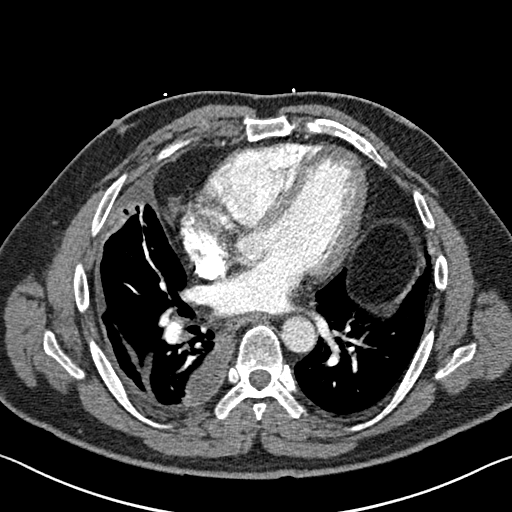
[im 158/284  lung]
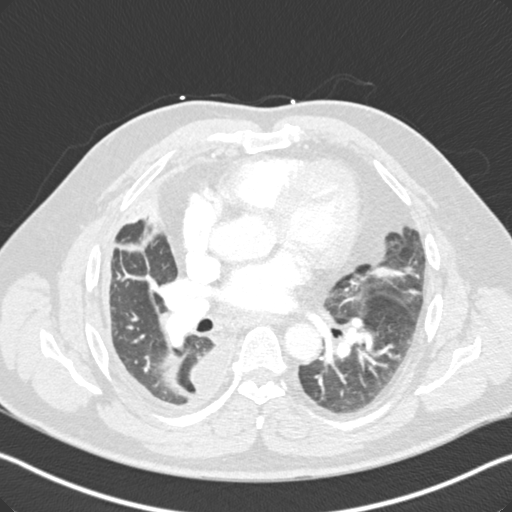
[im 173/284  soft-tissue]
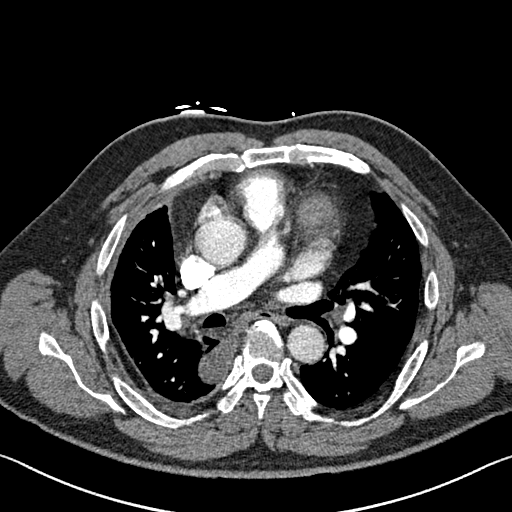
[im 189/284  lung]
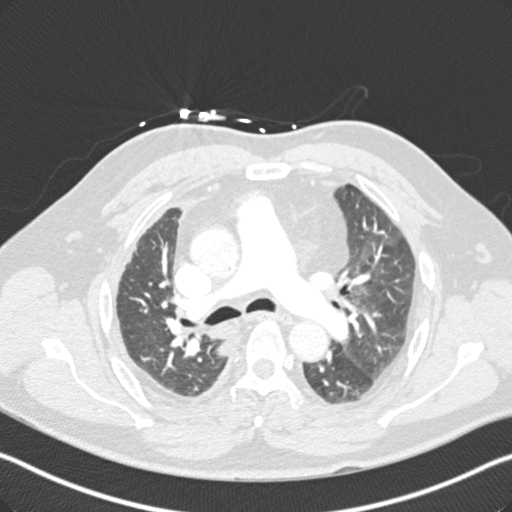
[im 221/284  soft-tissue]
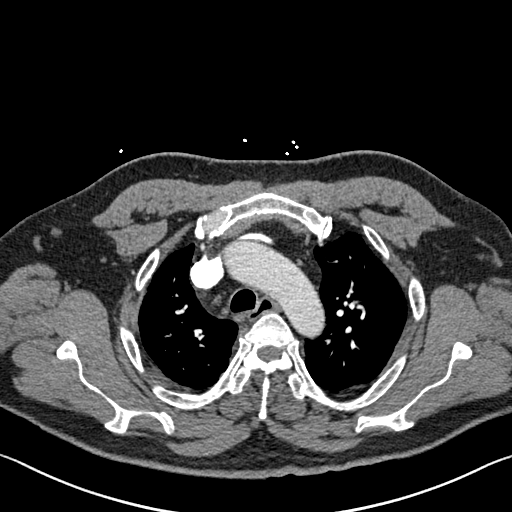
[im 236/284  lung]
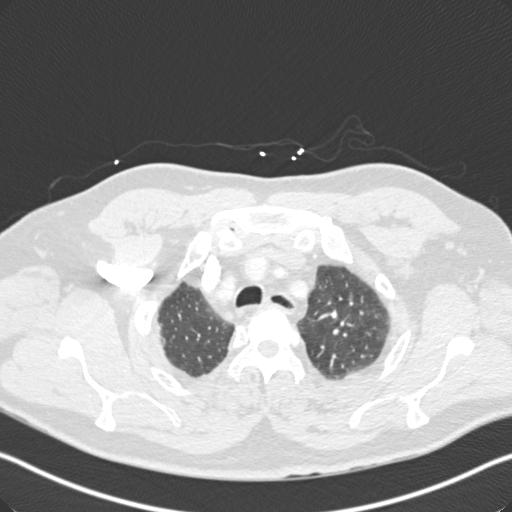
[im 252/284  soft-tissue]
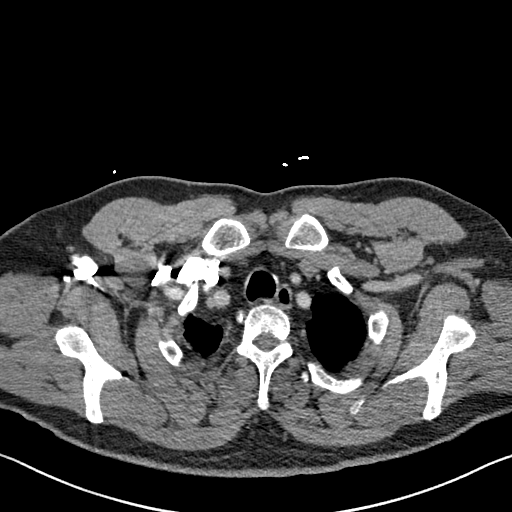
[im 268/284  lung]
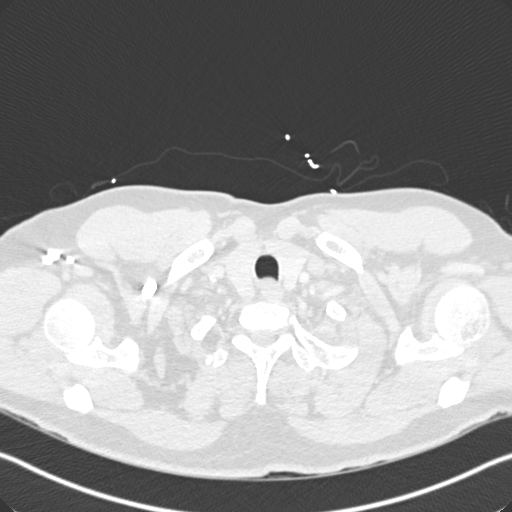

[Series 7: coronal mpr · coronal · 0.63mm/px · 3 of 141 slices shown]
[im 36/141  soft-tissue]
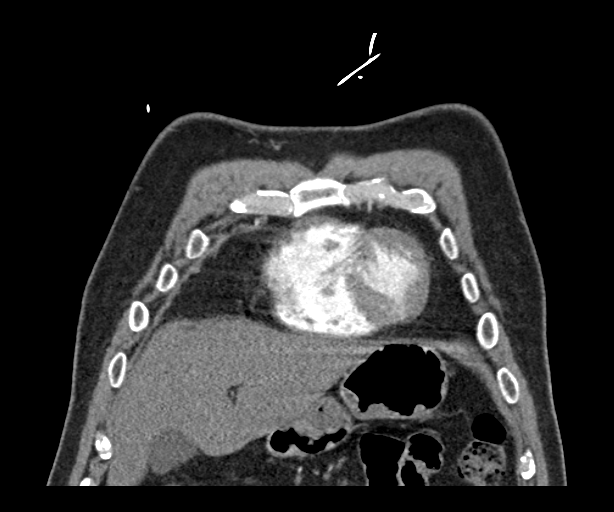
[im 71/141  soft-tissue]
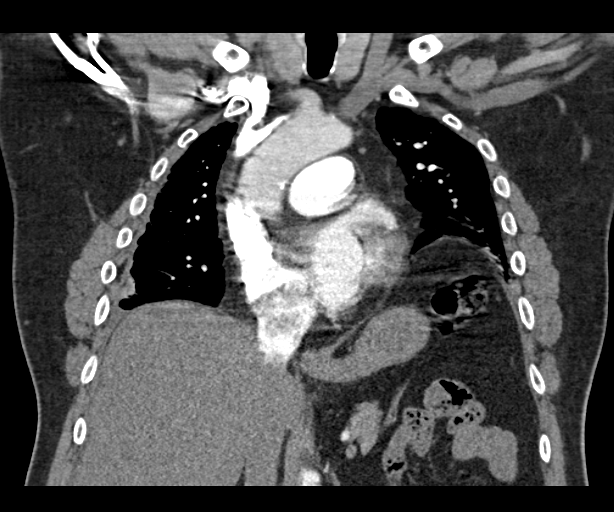
[im 106/141  soft-tissue]
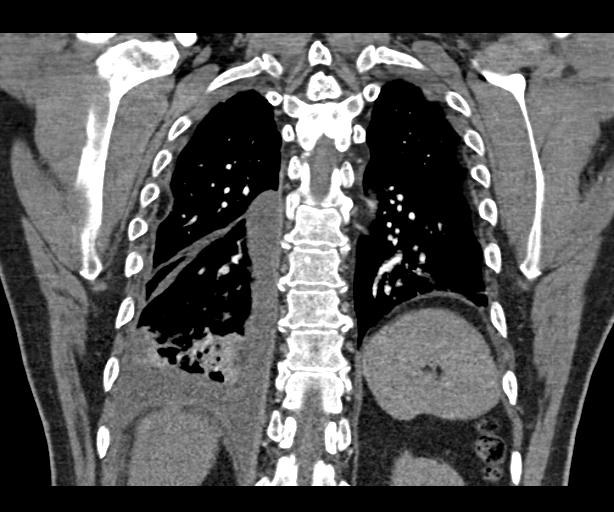

[18 of 46 positions shown; findings below may reference images not displayed]

FINDINGS: There is no demonstrable pulmonary embolus. There is no thoracic
aortic aneurysm or dissection.

Right pleural effusion is significantly smaller following
right-sided thoracentesis. There remains a fairly small right
pleural effusion which is predominantly free-flowing but with a
small amount of loculation. There is patchy airspace consolidation
and atelectasis in the right base. There is mild atelectasis in the
anterior left and right lung bases as well. Lungs elsewhere are
clear.

There is fairly extensive mediastinal fat. There is mild left
ventricular hypertrophy. Pericardium is not thickened.

There are small mediastinal lymph nodes but no lymph nodes meeting
size criteria for pathologic significance. Visualized thyroid
appears normal.

In the visualized upper abdomen, no lesion is appreciable. The
haziness in the upper abdominal mesenteric described 2 days prior
was not imaged on this study.

There are no blastic or lytic bone lesions.

Review of the MIP images confirms the above findings.
IMPRESSION: No demonstrable pulmonary embolus. Right effusion smaller following
thoracentesis. There is patchy consolidation atelectasis in the
right base. Areas of atelectasis more anteriorly in both lower lobes
identified. No appreciable adenopathy. Extensive mediastinal fat, a
finding of questionable significance. Left ventricular hypertrophy
present.

## 2023-02-05 ENCOUNTER — Encounter: Payer: Self-pay | Admitting: Gastroenterology
